# Patient Record
Sex: Female | Born: 1960 | Race: White | Hispanic: No | Marital: Married | Smoking: Former smoker
Health system: Southern US, Community
[De-identification: ages and names within clinical notes are randomized; demographics above are authoritative.]

## PROBLEM LIST (undated history)

## (undated) DIAGNOSIS — E119 Type 2 diabetes mellitus without complications: Secondary | ICD-10-CM

## (undated) DIAGNOSIS — I739 Peripheral vascular disease, unspecified: Secondary | ICD-10-CM

## (undated) HISTORY — PX: KNEE CARTILAGE SURGERY: SHX688

## (undated) HISTORY — PX: HERNIA REPAIR: SHX51

## (undated) HISTORY — DX: Peripheral vascular disease, unspecified: I73.9

## (undated) HISTORY — DX: Type 2 diabetes mellitus without complications: E11.9

---

## 2000-07-19 ENCOUNTER — Encounter: Payer: Self-pay | Admitting: Family Medicine

## 2000-07-19 ENCOUNTER — Ambulatory Visit (HOSPITAL_COMMUNITY): Admission: RE | Admit: 2000-07-19 | Discharge: 2000-07-19 | Payer: Self-pay | Admitting: Family Medicine

## 2000-07-26 ENCOUNTER — Encounter: Payer: Self-pay | Admitting: General Surgery

## 2000-07-26 ENCOUNTER — Encounter: Admission: RE | Admit: 2000-07-26 | Discharge: 2000-07-26 | Payer: Self-pay | Admitting: General Surgery

## 2000-08-08 ENCOUNTER — Ambulatory Visit (HOSPITAL_BASED_OUTPATIENT_CLINIC_OR_DEPARTMENT_OTHER): Admission: RE | Admit: 2000-08-08 | Discharge: 2000-08-08 | Payer: Self-pay | Admitting: General Surgery

## 2006-01-31 ENCOUNTER — Ambulatory Visit: Payer: Self-pay | Admitting: Internal Medicine

## 2006-03-28 ENCOUNTER — Other Ambulatory Visit: Payer: Self-pay

## 2006-04-02 ENCOUNTER — Inpatient Hospital Stay: Payer: Self-pay | Admitting: Obstetrics and Gynecology

## 2006-05-01 HISTORY — PX: ABDOMINAL HYSTERECTOMY: SHX81

## 2011-07-17 ENCOUNTER — Ambulatory Visit (HOSPITAL_COMMUNITY)
Admission: EM | Admit: 2011-07-17 | Discharge: 2011-07-17 | Discharge status: Against Medical Advice | Payer: Self-pay | Source: Ambulatory Visit | Attending: Cardiovascular Disease | Admitting: Cardiovascular Disease

## 2011-07-17 ENCOUNTER — Encounter (HOSPITAL_COMMUNITY)
Admission: EM | Discharge status: Absent without leave | Payer: Self-pay | Source: Ambulatory Visit | Attending: Cardiovascular Disease

## 2011-07-17 DIAGNOSIS — Z538 Procedure and treatment not carried out for other reasons: Secondary | ICD-10-CM | POA: Insufficient documentation

## 2011-07-17 DIAGNOSIS — R079 Chest pain, unspecified: Secondary | ICD-10-CM | POA: Insufficient documentation

## 2011-07-17 SURGERY — LEFT HEART CATHETERIZATION WITH CORONARY ANGIOGRAM
Anesthesia: LOCAL

## 2011-07-17 MED ORDER — FENTANYL CITRATE 0.05 MG/ML IJ SOLN
INTRAMUSCULAR | Status: AC
  Filled 2011-07-17: qty 2

## 2011-07-17 MED ORDER — MIDAZOLAM HCL 2 MG/2ML IJ SOLN
INTRAMUSCULAR | Status: AC
  Filled 2011-07-17: qty 2

## 2011-07-17 MED ORDER — BIVALIRUDIN 250 MG IV SOLR
INTRAVENOUS | Status: AC
  Filled 2011-07-17: qty 250

## 2012-01-17 ENCOUNTER — Ambulatory Visit: Payer: Self-pay | Admitting: General Practice

## 2013-05-29 ENCOUNTER — Ambulatory Visit: Payer: Self-pay | Admitting: Internal Medicine

## 2013-09-04 DIAGNOSIS — K5900 Constipation, unspecified: Secondary | ICD-10-CM | POA: Insufficient documentation

## 2013-09-04 DIAGNOSIS — M8580 Other specified disorders of bone density and structure, unspecified site: Secondary | ICD-10-CM | POA: Insufficient documentation

## 2013-09-04 DIAGNOSIS — F419 Anxiety disorder, unspecified: Secondary | ICD-10-CM | POA: Insufficient documentation

## 2014-01-07 DIAGNOSIS — J309 Allergic rhinitis, unspecified: Secondary | ICD-10-CM | POA: Insufficient documentation

## 2014-05-28 ENCOUNTER — Ambulatory Visit: Payer: Self-pay | Admitting: Family Medicine

## 2015-03-30 DIAGNOSIS — R7303 Prediabetes: Secondary | ICD-10-CM | POA: Insufficient documentation

## 2015-03-31 ENCOUNTER — Other Ambulatory Visit: Payer: Self-pay | Admitting: Internal Medicine

## 2015-03-31 DIAGNOSIS — Z1239 Encounter for other screening for malignant neoplasm of breast: Secondary | ICD-10-CM

## 2015-08-30 ENCOUNTER — Encounter: Admission: RE | Payer: Self-pay | Source: Ambulatory Visit

## 2015-08-30 ENCOUNTER — Ambulatory Visit
Admission: RE | Admit: 2015-08-30 | Payer: PRIVATE HEALTH INSURANCE | Source: Ambulatory Visit | Admitting: Gastroenterology

## 2015-08-30 SURGERY — COLONOSCOPY WITH PROPOFOL
Anesthesia: General

## 2015-09-03 ENCOUNTER — Ambulatory Visit
Admission: RE | Admit: 2015-09-03 | Payer: PRIVATE HEALTH INSURANCE | Source: Ambulatory Visit | Admitting: Gastroenterology

## 2015-09-03 ENCOUNTER — Encounter: Admission: RE | Payer: Self-pay | Source: Ambulatory Visit

## 2015-09-03 SURGERY — COLONOSCOPY WITH PROPOFOL
Anesthesia: General

## 2016-04-01 DIAGNOSIS — M25572 Pain in left ankle and joints of left foot: Secondary | ICD-10-CM | POA: Insufficient documentation

## 2016-04-14 ENCOUNTER — Encounter (INDEPENDENT_AMBULATORY_CARE_PROVIDER_SITE_OTHER): Payer: Self-pay | Admitting: Vascular Surgery

## 2016-04-18 ENCOUNTER — Encounter (INDEPENDENT_AMBULATORY_CARE_PROVIDER_SITE_OTHER): Payer: Self-pay | Admitting: Vascular Surgery

## 2016-04-18 ENCOUNTER — Ambulatory Visit (INDEPENDENT_AMBULATORY_CARE_PROVIDER_SITE_OTHER): Payer: PRIVATE HEALTH INSURANCE | Admitting: Vascular Surgery

## 2016-04-18 VITALS — BP 116/78 | HR 73 | Resp 16 | Ht 67.0 in | Wt 190.0 lb

## 2016-04-18 DIAGNOSIS — M79605 Pain in left leg: Secondary | ICD-10-CM

## 2016-04-18 DIAGNOSIS — E119 Type 2 diabetes mellitus without complications: Secondary | ICD-10-CM | POA: Diagnosis not present

## 2016-04-18 DIAGNOSIS — M7989 Other specified soft tissue disorders: Secondary | ICD-10-CM | POA: Insufficient documentation

## 2016-04-18 DIAGNOSIS — M79609 Pain in unspecified limb: Secondary | ICD-10-CM | POA: Insufficient documentation

## 2016-04-18 DIAGNOSIS — M79604 Pain in right leg: Secondary | ICD-10-CM | POA: Diagnosis not present

## 2016-04-18 DIAGNOSIS — I83813 Varicose veins of bilateral lower extremities with pain: Secondary | ICD-10-CM | POA: Diagnosis not present

## 2016-04-18 NOTE — Assessment & Plan Note (Signed)
See below

## 2016-04-18 NOTE — Assessment & Plan Note (Signed)
Suspicious for venous disease.  Could have musculoskeletal pain as well.

## 2016-04-18 NOTE — Assessment & Plan Note (Signed)
Suspicious for venous disease.  See below

## 2016-04-18 NOTE — Progress Notes (Signed)
Patient ID: Makayla Villarreal, female   DOB: 12/11/1960, 55 y.o.   MRN: [id number]  Chief Complaint  Patient presents with  . New Evaluation    Varicose Veins    HPI Makayla Glennon HamiltonRenee Villarreal is a 55 y.o. female.  I am asked to see the patient by Dr. Thedore MinsSingh for evaluation of painful varicose veins.  The patient presents with complaints of symptomatic varicosities of the Lower extremities bilaterally. The patient reports a long standing history of varicosities and they have become painful over time. There was no clear inciting event or causative factor that started the symptoms.  The left leg is more severly affected. The patient elevates the legs for relief. The pain is described as aching and heaviness as well as tiredness in the evenings are. The symptoms are generally most severe in the evening, particularly when they have been on their feet for long periods of time. Elevation and anti-inflammatories has been used to try to improve the symptoms with limited success. The patient complains of mild bilateral lower extremity swelling as an associated symptom. The patient has no previous history of deep venous thrombosis or superficial thrombophlebitis to their knowledge.     Past Medical History:  Diagnosis Date  . Diabetes mellitus without complication (HCC)   . Peripheral arterial disease [facility](HCC)     Past Surgical History:  Procedure Laterality Date  . CESAREAN SECTION     twice  . HERNIA REPAIR    . KNEE CARTILAGE SURGERY      Family History  Problem Relation Age of Onset  . Diabetes Mother   . Hyperlipidemia Mother   . Varicose Veins Mother   . Varicose Veins Maternal Grandfather   no bleeding or clotting disorders  Social History Social History  Substance Use Topics  . Smoking status: Former Smoker    Quit date: 2005  . Smokeless tobacco: Never Used  . Alcohol use Yes  No IVDU  Allergies  Allergen Reactions  . Codeine Nausea Only    Other reaction(s): Hallucination  .  Etodolac     Other reaction(s): Other (See Comments) Swelling/stomach issues with burning    Current Outpatient Prescriptions  Medication Sig Dispense Refill  . Ascorbic Acid (VITAMIN C) 1000 MG tablet Take by mouth.    . Garlic 1000 MG CAPS Take by mouth.    Marland Kitchen. ibuprofen (ADVIL,MOTRIN) 200 MG tablet Take by mouth.    . Loratadine 10 MG CAPS Take by mouth.    . magnesium oxide (MAG-OX) 400 MG tablet Take by mouth.    . metFORMIN (GLUCOPHAGE) 500 MG tablet take 1 tablet by mouth twice a day with meals    . thyroid (ARMOUR THYROID) 120 MG tablet Take by mouth.    . vitamin E 400 UNIT capsule Take by mouth.     No current facility-administered medications for this visit.       REVIEW OF SYSTEMS (Negative unless checked)  Constitutional: [] Weight loss  [] Fever  [] Chills Cardiac: [] Chest pain   [] Chest pressure   [] Palpitations   [] Shortness of breath when laying flat   [] Shortness of breath at rest   [] Shortness of breath with exertion. Vascular:  [] Pain in legs with walking   [] Pain in legs at rest   [] Pain in legs when laying flat   [] Claudication   [x] Pain in feet when walking  [] Pain in feet at rest  [] Pain in feet when laying flat   [] History of DVT   [] Phlebitis   [  x]Swelling in legs   [x] Varicose veins   [] Non-healing ulcers Pulmonary:   [] Uses home oxygen   [] Productive cough   [] Hemoptysis   [] Wheeze  [] COPD   [] Asthma Neurologic:  [] Dizziness  [] Blackouts   [] Seizures   [] History of stroke   [] History of TIA  [] Aphasia   [] Temporary blindness   [] Dysphagia   [] Weakness or numbness in arms   [] Weakness or numbness in legs Musculoskeletal:  [x] Arthritis   [] Joint swelling   [x] Joint pain   [] Low back pain Hematologic:  [] Easy bruising  [] Easy bleeding   [] Hypercoagulable state   [] Anemic  [] Hepatitis Gastrointestinal:  [] Blood in stool   [] Vomiting blood  [] Gastroesophageal reflux/heartburn   [] Abdominal pain Genitourinary:  [] Chronic kidney disease   [] Difficult urination   [] Frequent urination  [] Burning with urination   [] Hematuria Skin:  [] Rashes   [] Ulcers   [] Wounds Psychological:  [] History of anxiety   []  History of major depression.    Physical Exam BP 116/78 (BP Location: Right Arm)   Pulse 73   Resp 16   Ht 5\' 7"  (1.702 m)   Wt 190 lb (86.2 kg)   BMI 29.76 kg/m  Gen:  WD/WN, NAD Head: /AT, No temporalis wasting.  Ear/Nose/Throat: Hearing grossly intact, dentition good Eyes: Sclera non-icteric. Conjunctiva clear Neck: Supple, no nuchal rigidity. Trachea midline Pulmonary:  Good air movement, no use of accessory muscles, respirations not labored.  Cardiac: RRR, No JVD Vascular: Varicosities diffuse and measuring up to 3 mm in the right lower extremity        Varicosities scattered and measuring up to 2 mm in the left lower extremity Vessel Right Left  Radial Palpable Palpable  Ulnar Palpable Palpable  Brachial Palpable Palpable  Carotid Palpable, without bruit Palpable, without bruit  Aorta Not palpable N/A  Femoral Palpable Palpable  Popliteal Palpable Palpable  PT Palpable Palpable  DP Palpable Palpable   Gastrointestinal: soft, non-tender/non-distended. No guarding/reflex. No masses, surgical incisions, or scars. Musculoskeletal: M/S 5/5 throughout.  Trace RLE edema. Trace LLE edema Neurologic: Sensation grossly intact in extremities.  Symmetrical.  Speech is fluent.  Psychiatric: Judgment intact, Mood & affect appropriate for pt's clinical situation. Dermatologic: No rashes or ulcers noted.  No cellulitis or open wounds. Lymph : No Cervical, Axillary, or Inguinal lymphadenopathy.   Radiology No results found.  Labs No results found for this or any previous visit (from the past 2160 hour(s)).  Assessment/Plan:  Diabetes (HCC) blood glucose control important in reducing the progression of atherosclerotic disease. Also, involved in wound healing. On appropriate medications.   Pain in limb Suspicious for venous disease.   Could have musculoskeletal pain as well.  Swelling of limb Suspicious for venous disease.  See below  Varicose veins of leg with pain, bilateral See below    The patient has symptoms consistent with chronic venous insufficiency. We discussed the natural history and treatment options for venous disease. I recommended the regular use of 20 - 30 mm Hg compression stockings, and prescribed these today. I recommended leg elevation and anti-inflammatories as needed for pain. I have also recommended a complete venous duplex to assess the venous system for reflux or thrombotic issues. This can be done at the patient's convenience. I will see the patient back in 3 months to assess the response to conservative management, and determine further treatment options.     Festus BarrenJason Nyleah Mcginnis 04/18/2016, 4:02 PM   This note was created with Dragon medical transcription system.  Any errors from dictation are  unintentional.

## 2016-04-18 NOTE — Patient Instructions (Signed)
Varicose Veins Varicose veins are veins that have become enlarged and twisted. They are usually seen in the legs but can occur in other parts of the body as well. CAUSES This condition is the result of valves in the veins not working properly. Valves in the veins help to return blood from the leg to the heart. If these valves are damaged, blood flows backward and backs up into the veins in the leg near the skin. This causes the veins to become larger. RISK FACTORS People who are on their feet a lot, who are pregnant, or who are overweight are more likely to develop varicose veins. SIGNS AND SYMPTOMS  Bulging, twisted-appearing, bluish veins, most commonly found on the legs.  Leg pain or a feeling of heaviness. These symptoms may be worse at the end of the day.  Leg swelling.  Changes in skin color. DIAGNOSIS A health care provider can usually diagnose varicose veins by examining your legs. Your health care provider may also recommend an ultrasound of your leg veins. TREATMENT Most varicose veins can be treated at home.However, other treatments are available for people who have persistent symptoms or want to improve the cosmetic appearance of the varicose veins. These treatment options include:  Sclerotherapy. A solution is injected into the vein to close it off.  Laser treatment. A laser is used to heat the vein to close it off.  Radiofrequency vein ablation. An electrical current produced by radio waves is used to close off the vein.  Phlebectomy. The vein is surgically removed through small incisions made over the varicose vein.  Vein ligation and stripping. The vein is surgically removed through incisions made over the varicose vein after the vein has been tied (ligated). HOME CARE INSTRUCTIONS   Do not stand or sit in one position for long periods of time. Do not sit with your legs crossed. Rest with your legs raised during the day.  Wear compression stockings as directed by  your health care provider. These stockings help to prevent blood clots and reduce swelling in your legs.  Do not wear other tight, encircling garments around your legs, pelvis, or waist.  Walk as much as possible to increase blood flow.  Raise the foot of your bed at night with 2-inch blocks.  If you get a cut in the skin over the vein and the vein bleeds, lie down with your leg raised and press on it with a clean cloth until the bleeding stops. Then place a bandage (dressing) on the cut. See your health care provider if it continues to bleed. SEEK MEDICAL CARE IF:  The skin around your ankle starts to break down.  You have pain, redness, tenderness, or hard swelling in your leg over a vein.  You are uncomfortable because of leg pain. This information is not intended to replace advice given to you by your health care provider. Make sure you discuss any questions you have with your health care provider. Document Released: 01/25/2005 Document Revised: 08/09/2015 Document Reviewed: 09/02/2013 Elsevier Interactive Patient Education  2017 [employer]  

## 2016-04-18 NOTE — Assessment & Plan Note (Signed)
blood glucose control important in reducing the progression of atherosclerotic disease. Also, involved in wound healing. On appropriate medications.  

## 2016-07-10 ENCOUNTER — Other Ambulatory Visit: Payer: Self-pay | Admitting: Internal Medicine

## 2016-07-10 DIAGNOSIS — M5416 Radiculopathy, lumbar region: Secondary | ICD-10-CM

## 2016-07-11 DIAGNOSIS — G8929 Other chronic pain: Secondary | ICD-10-CM | POA: Insufficient documentation

## 2016-07-11 DIAGNOSIS — M5416 Radiculopathy, lumbar region: Secondary | ICD-10-CM | POA: Insufficient documentation

## 2016-07-18 ENCOUNTER — Ambulatory Visit (INDEPENDENT_AMBULATORY_CARE_PROVIDER_SITE_OTHER): Payer: PRIVATE HEALTH INSURANCE | Admitting: Vascular Surgery

## 2016-07-18 ENCOUNTER — Encounter (INDEPENDENT_AMBULATORY_CARE_PROVIDER_SITE_OTHER): Payer: Self-pay

## 2016-07-18 ENCOUNTER — Ambulatory Visit (INDEPENDENT_AMBULATORY_CARE_PROVIDER_SITE_OTHER): Payer: PRIVATE HEALTH INSURANCE

## 2016-07-18 ENCOUNTER — Encounter (INDEPENDENT_AMBULATORY_CARE_PROVIDER_SITE_OTHER): Payer: Self-pay | Admitting: Vascular Surgery

## 2016-07-18 VITALS — BP 130/73 | HR 58 | Resp 16 | Ht 67.5 in | Wt 189.0 lb

## 2016-07-18 DIAGNOSIS — I872 Venous insufficiency (chronic) (peripheral): Secondary | ICD-10-CM | POA: Diagnosis not present

## 2016-07-18 DIAGNOSIS — I83813 Varicose veins of bilateral lower extremities with pain: Secondary | ICD-10-CM | POA: Diagnosis not present

## 2016-07-18 DIAGNOSIS — E119 Type 2 diabetes mellitus without complications: Secondary | ICD-10-CM

## 2016-07-21 DIAGNOSIS — I872 Venous insufficiency (chronic) (peripheral): Secondary | ICD-10-CM | POA: Insufficient documentation

## 2016-07-21 NOTE — Progress Notes (Signed)
Subjective:    Patient ID: Makayla Villarreal, female    DOB: 11-20-1960, 56 y.o.   MRN: [id number] Chief Complaint  Patient presents with  . Re-evaluation    Ultrasound follow up   Patient presents to review vascular studies. Patient last seen on 04/18/16 for evaluation of painful varicose veins.  The patient presents with complaints of symptomatic varicosities of the Lower extremities bilaterally. The patient reports a long standing history of varicosities and they have become painful over time. There was no clear inciting event or causative factor that started the symptoms.  The left leg is more severly affected. The patient elevates the legs for relief. The pain is described as aching and heaviness as well as tiredness in the evenings are. The symptoms are generally most severe in the evening, particularly when they have been on their feet for long periods of time. The patient complains of mild bilateral lower extremity swelling as an associated symptom. The patient has no previous history of deep venous thrombosis or superficial thrombophlebitis to their knowledge. Over the last three months, the patient has worn medical grade one compression stockings, engaged in elevation of her lower extremities and has remained active with minimal relief requiring the use of OTC analgesics. Her discomfort persists and has started to interfere with her ability to function on a daily basis.    Review of Systems  Constitutional: Negative.   HENT: Negative.   Eyes: Negative.   Respiratory: Negative.   Cardiovascular: Positive for leg swelling.       Lower Extremity Pain  Gastrointestinal: Negative.   Endocrine: Negative.   Genitourinary: Negative.   Musculoskeletal: Negative.   Skin: Negative.   Allergic/Immunologic: Negative.   Neurological: Negative.   Hematological: Negative.   Psychiatric/Behavioral: Negative.        Objective:   Physical Exam  Constitutional: She is oriented to person, place,  and time. She appears well-developed and well-nourished.  HENT:  Head: Normocephalic and atraumatic.  Right Ear: External ear normal.  Left Ear: External ear normal.  Eyes: Conjunctivae and EOM are normal. Pupils are equal, round, and reactive to light.  Neck: Normal range of motion.  Cardiovascular: Normal rate, regular rhythm, normal heart sounds and intact distal pulses.   Pulses:      Radial pulses are 2+ on the right side, and 2+ on the left side.       Dorsalis pedis pulses are 2+ on the right side, and 2+ on the left side.       Posterior tibial pulses are 2+ on the right side, and 2+ on the left side.  Pulmonary/Chest: Effort normal.  Musculoskeletal: Normal range of motion. She exhibits edema (Bilateral Lower Extremity Moderate Edema. >1cm varicose veins noted bilatearally. ).  Neurological: She is alert and oriented to person, place, and time.  Skin: Skin is warm and dry.  Psychiatric: She has a normal mood and affect. Her behavior is normal. Judgment and thought content normal.   BP 130/73 (BP Location: Left Arm)   Pulse (!) 58   Resp 16   Ht 5' 7.5" (1.715 m)   Wt 189 lb (85.7 kg)   BMI 29.16 kg/m   Past Medical History:  Diagnosis Date  . Diabetes mellitus without complication (HCC)   . Peripheral arterial disease (HCC)    Social History   Social History  . Marital status: Married    Spouse name: N/A  . Number of children: N/A  . Years of education: N/A  Occupational History  . Not on file.   Social History Main Topics  . Smoking status: Former Smoker    Quit date: 2005  . Smokeless tobacco: Never Used  . Alcohol use Yes  . Drug use: No  . Sexual activity: Not on file   Other Topics Concern  . Not on file   Social History Narrative  . No narrative on file   Past Surgical History:  Procedure Laterality Date  . CESAREAN SECTION     twice  . HERNIA REPAIR    . KNEE CARTILAGE SURGERY     Family History  Problem Relation Age of Onset  .  Diabetes Mother   . Hyperlipidemia Mother   . Varicose Veins Mother   . Varicose Veins Maternal Grandfather    Allergies  Allergen Reactions  . Codeine Nausea Only    Other reaction(s): Hallucination  . Etodolac     Other reaction(s): Other (See Comments) Swelling/stomach issues with burning      Assessment & Plan:  Patient presents to review vascular studies. Patient last seen on 04/18/16 for evaluation of painful varicose veins.  The patient presents with complaints of symptomatic varicosities of the Lower extremities bilaterally. The patient reports a long standing history of varicosities and they have become painful over time. There was no clear inciting event or causative factor that started the symptoms.  The left leg is more severly affected. The patient elevates the legs for relief. The pain is described as aching and heaviness as well as tiredness in the evenings are. The symptoms are generally most severe in the evening, particularly when they have been on their feet for long periods of time. The patient complains of mild bilateral lower extremity swelling as an associated symptom. The patient has no previous history of deep venous thrombosis or superficial thrombophlebitis to their knowledge. Over the last three months, the patient has worn medical grade one compression stockings, engaged in elevation of her lower extremities and has remained active with minimal relief requiring the use of OTC analgesics. Her discomfort persists and has started to interfere with her ability to function on a daily basis.   1. Venous insufficiency of both lower extremities - New Patient with reflux on duplex. Minimal improvement in symptoms and physical exam. Patient has tried conservative therapy without improvement in symptoms. The patient is likely to benefit from endovenous laser ablation. I have discussed the risks and benefits of the procedure. The risks primarily include DVT, recanalization,  bleeding, infection, and inability to gain access. Will call patient with insurance approval.  2. Varicose veins of leg with pain, bilateral - Worsening Patient with reflux on duplex. Minimal improvement in symptoms and physical exam. Patient has tried conservative therapy without improvement in symptoms. The patient is likely to benefit from endovenous laser ablation. I have discussed the risks and benefits of the procedure. The risks primarily include DVT, recanalization, bleeding, infection, and inability to gain access. Will call patient with insurance approval.  3. Type 2 diabetes mellitus without complication, without long-term current use of insulin (HCC) - Stable Encouraged good control as its slows the progression of atherosclerotic disease  Current Outpatient Prescriptions on File Prior to Visit  Medication Sig Dispense Refill  . Ascorbic Acid (VITAMIN C) 1000 MG tablet Take by mouth.    . Garlic 1000 MG CAPS Take by mouth.    Marland Kitchen. ibuprofen (ADVIL,MOTRIN) 200 MG tablet Take by mouth.    . Loratadine 10 MG CAPS Take by mouth.    .Marland Kitchen  magnesium oxide (MAG-OX) 400 MG tablet Take by mouth.    . metFORMIN (GLUCOPHAGE) 500 MG tablet take 1 tablet by mouth twice a day with meals    . thyroid (ARMOUR THYROID) 120 MG tablet Take by mouth.    . vitamin E 400 UNIT capsule Take by mouth.     No current facility-administered medications on file prior to visit.    There are no Patient Instructions on file for this visit. No Follow-up on file.  Lanecia Sliva A Khristina Janota, PA-C

## 2016-07-24 ENCOUNTER — Ambulatory Visit
Admission: RE | Admit: 2016-07-24 | Discharge: 2016-07-24 | Disposition: A | Discharge status: Home / Self Care | Payer: PRIVATE HEALTH INSURANCE | Source: Ambulatory Visit | Attending: Internal Medicine | Admitting: Internal Medicine

## 2016-07-24 DIAGNOSIS — M5126 Other intervertebral disc displacement, lumbar region: Secondary | ICD-10-CM | POA: Insufficient documentation

## 2016-07-24 DIAGNOSIS — M5416 Radiculopathy, lumbar region: Secondary | ICD-10-CM | POA: Diagnosis present

## 2016-07-24 DIAGNOSIS — M48061 Spinal stenosis, lumbar region without neurogenic claudication: Secondary | ICD-10-CM | POA: Insufficient documentation

## 2016-07-24 DIAGNOSIS — M4696 Unspecified inflammatory spondylopathy, lumbar region: Secondary | ICD-10-CM | POA: Diagnosis not present

## 2016-07-24 LAB — POCT I-STAT CREATININE: Creatinine, Ser: 1.2 mg/dL — ABNORMAL HIGH (ref 0.44–1.00)

## 2016-07-24 MED ORDER — GADOBENATE DIMEGLUMINE 529 MG/ML IV SOLN
20.0000 mL | Freq: Once | INTRAVENOUS | Status: AC | PRN
  Administered 2016-07-24: 18 mL via INTRAVENOUS

## 2016-10-27 ENCOUNTER — Encounter (INDEPENDENT_AMBULATORY_CARE_PROVIDER_SITE_OTHER): Payer: Self-pay | Admitting: Vascular Surgery

## 2016-10-27 ENCOUNTER — Ambulatory Visit (INDEPENDENT_AMBULATORY_CARE_PROVIDER_SITE_OTHER): Payer: PRIVATE HEALTH INSURANCE | Admitting: Vascular Surgery

## 2016-10-27 VITALS — BP 110/60 | HR 72 | Resp 16 | Wt 188.0 lb

## 2016-10-27 DIAGNOSIS — I83813 Varicose veins of bilateral lower extremities with pain: Secondary | ICD-10-CM | POA: Diagnosis not present

## 2016-10-27 NOTE — Progress Notes (Signed)
No problem-specific Assessment & Plan notes found for this encounter.   The patient's right lower extremity was sterilely prepped and draped. The ultrasound machine was used to visualize the saphenous vein throughout its course. A segment in the lower thigh was selected for access. The saphenous vein was accessed with a mild amount of difficulty using ultrasound guidance with a micro puncture needle. A micro puncture wire and sheath were then placed. A 0.018 wire was placed beyond the saphenofemoral junction through the sheath and the micro puncture sheath was removed. The 65 cm sheath was then placed over the wire and the wire and dilator were removed. The laser fiber was placed through the sheath and its tip was placed approximately 3-4 cm below the saphenofemoral junction. Tumescent anesthesia was then created with a dilute lidocaine solution. Laser energy was then delivered with constant withdrawal of the sheath and laser fiber. Approximately 803 Joules of energy were delivered over a length of 19 cm using the 1470 Hz VenaCure machine at [employer]. Sterile dressings were placed. The patient tolerated the procedure well without complications.

## 2016-10-27 NOTE — Assessment & Plan Note (Signed)
See laser note 

## 2016-10-31 ENCOUNTER — Ambulatory Visit (INDEPENDENT_AMBULATORY_CARE_PROVIDER_SITE_OTHER): Payer: PRIVATE HEALTH INSURANCE

## 2016-10-31 DIAGNOSIS — I83813 Varicose veins of bilateral lower extremities with pain: Secondary | ICD-10-CM | POA: Diagnosis not present

## 2016-11-03 ENCOUNTER — Ambulatory Visit (INDEPENDENT_AMBULATORY_CARE_PROVIDER_SITE_OTHER): Payer: PRIVATE HEALTH INSURANCE | Admitting: Vascular Surgery

## 2016-11-03 ENCOUNTER — Encounter (INDEPENDENT_AMBULATORY_CARE_PROVIDER_SITE_OTHER): Payer: Self-pay | Admitting: Vascular Surgery

## 2016-11-03 VITALS — BP 108/70 | HR 66 | Resp 14 | Ht 67.0 in | Wt 190.0 lb

## 2016-11-03 DIAGNOSIS — I83813 Varicose veins of bilateral lower extremities with pain: Secondary | ICD-10-CM

## 2016-11-03 NOTE — Progress Notes (Signed)
Varicose veins of leg with pain, bilateral See laser note   The patient's left lower extremity was sterilely prepped and draped. The ultrasound machine was used to visualize the saphenous vein throughout its course. A segment just below the knee was selected for access. The saphenous vein was accessed with minimal difficulty using ultrasound guidance with a micro puncture needle. A micro puncture wire and sheath were then placed. A 0.018 wire was placed beyond the saphenofemoral junction through the sheath and the micro puncture sheath was removed. The 65 cm sheath was then placed over the wire and the wire and dilator were removed. The laser fiber was placed through the sheath and its tip was placed approximately 4 cm below the saphenofemoral junction. Tumescent anesthesia was then created with a dilute lidocaine solution. Laser energy was then delivered with constant withdrawal of the sheath and laser fiber. Approximately 1230 Joules of energy were delivered over a length of 33 cm using the 1470 Hz VenaCure machine at [employer]. Sterile dressings were placed. The patient tolerated the procedure well without complications.

## 2016-11-03 NOTE — Assessment & Plan Note (Signed)
See laser note 

## 2016-11-06 ENCOUNTER — Other Ambulatory Visit (INDEPENDENT_AMBULATORY_CARE_PROVIDER_SITE_OTHER): Payer: PRIVATE HEALTH INSURANCE

## 2016-11-06 ENCOUNTER — Other Ambulatory Visit (INDEPENDENT_AMBULATORY_CARE_PROVIDER_SITE_OTHER): Payer: Self-pay | Admitting: Vascular Surgery

## 2016-11-06 DIAGNOSIS — I8392 Asymptomatic varicose veins of left lower extremity: Secondary | ICD-10-CM

## 2016-11-28 ENCOUNTER — Ambulatory Visit (INDEPENDENT_AMBULATORY_CARE_PROVIDER_SITE_OTHER): Payer: PRIVATE HEALTH INSURANCE | Admitting: Vascular Surgery

## 2016-11-28 ENCOUNTER — Encounter (INDEPENDENT_AMBULATORY_CARE_PROVIDER_SITE_OTHER): Payer: Self-pay | Admitting: Vascular Surgery

## 2016-11-28 VITALS — BP 120/69 | HR 63 | Resp 15 | Ht 67.0 in | Wt 191.0 lb

## 2016-11-28 DIAGNOSIS — E119 Type 2 diabetes mellitus without complications: Secondary | ICD-10-CM

## 2016-11-28 DIAGNOSIS — I83813 Varicose veins of bilateral lower extremities with pain: Secondary | ICD-10-CM | POA: Diagnosis not present

## 2016-11-28 DIAGNOSIS — M79604 Pain in right leg: Secondary | ICD-10-CM

## 2016-11-28 DIAGNOSIS — M79605 Pain in left leg: Secondary | ICD-10-CM | POA: Diagnosis not present

## 2016-11-28 NOTE — Assessment & Plan Note (Signed)
Marked improvement after laser ablation.  Residual varicosities are reasonably mild.  No sclerotherapy currently planned.  See in office in 6 months.

## 2016-11-28 NOTE — Assessment & Plan Note (Signed)
Improved after laser ablation

## 2016-11-28 NOTE — Progress Notes (Signed)
MRN : [id number]  Makayla Villarreal is a 56 y.o. (05/25/1960) female who presents with chief complaint of  Chief Complaint  Patient presents with  . Re-evaluation    3-4 week post laser, left leg  .  History of Present Illness: Patient returns today in follow up of venous insufficiency.  She has undergone BLE GSV EVLT without complications.  She notices significant improvement in the heaviness and tiredness in both legs.  She does not really have much residual painful varicosities in either leg.  She reports some soreness and bruising which have largely resolved.    Current Outpatient Prescriptions  Medication Sig Dispense Refill  . ALPRAZolam (XANAX) 0.5 MG tablet take 1 tablet by mouth ONE HOUR PRIOR TO PROCEDURE AND 1 TABLET WHEN YOU ARRIVE AT THE OFFICE  0  . Ascorbic Acid (VITAMIN C) 1000 MG tablet Take by mouth.    . diazepam (VALIUM) 2 MG tablet take 1 tablet by mouth at bedtime if needed for anxiety  0  . Garlic 1000 MG CAPS Take by mouth.    Marland Kitchen. ibuprofen (ADVIL,MOTRIN) 200 MG tablet Take by mouth.    . Loratadine 10 MG CAPS Take by mouth.    . magnesium oxide (MAG-OX) 400 MG tablet Take by mouth.    . metFORMIN (GLUCOPHAGE) 500 MG tablet take 1 tablet by mouth twice a day with meals    . thyroid (ARMOUR THYROID) 120 MG tablet Take by mouth.    . vitamin E 400 UNIT capsule Take by mouth.     No current facility-administered medications for this visit.     Past Medical History:  Diagnosis Date  . Diabetes mellitus without complication (HCC)   . Peripheral arterial disease [facility](HCC)     Past Surgical History:  Procedure Laterality Date  . CESAREAN SECTION     twice  . HERNIA REPAIR    . KNEE CARTILAGE SURGERY      Family History  Problem Relation Age of Onset  . Diabetes Mother   . Hyperlipidemia Mother   . Varicose Veins Mother   . Varicose Veins Maternal Grandfather   no bleeding or clotting disorders  Social History       Social History  Substance Use  Topics  . Smoking status: Former Smoker    Quit date: 2005  . Smokeless tobacco: Never Used  . Alcohol use Yes   No IVDU       Allergies  Allergen Reactions  . Codeine Nausea Only    Other reaction(s): Hallucination  . Etodolac     Other reaction(s): Other (See Comments) Swelling/stomach issues with burning     No current facility-administered medications for this visit.       REVIEW OF SYSTEMS (Negative unless checked)  Constitutional: [] Weight loss  [] Fever  [] Chills Cardiac: [] Chest pain   [] Chest pressure   [] Palpitations   [] Shortness of breath when laying flat   [] Shortness of breath at rest   [] Shortness of breath with exertion. Vascular:  [] Pain in legs with walking   [] Pain in legs at rest   [] Pain in legs when laying flat   [] Claudication   [x] Pain in feet when walking  [] Pain in feet at rest  [] Pain in feet when laying flat   [] History of DVT   [] Phlebitis   [x] Swelling in legs   [x] Varicose veins   [] Non-healing ulcers Pulmonary:   [] Uses home oxygen   [] Productive cough   [] Hemoptysis   [] Wheeze  [] COPD   []   Asthma Neurologic:  [] Dizziness  [] Blackouts   [] Seizures   [] History of stroke   [] History of TIA  [] Aphasia   [] Temporary blindness   [] Dysphagia   [] Weakness or numbness in arms   [] Weakness or numbness in legs Musculoskeletal:  [x] Arthritis   [] Joint swelling   [x] Joint pain   [] Low back pain Hematologic:  [] Easy bruising  [] Easy bleeding   [] Hypercoagulable state   [] Anemic  [] Hepatitis Gastrointestinal:  [] Blood in stool   [] Vomiting blood  [] Gastroesophageal reflux/heartburn   [] Abdominal pain Genitourinary:  [] Chronic kidney disease   [] Difficult urination  [] Frequent urination  [] Burning with urination   [] Hematuria Skin:  [] Rashes   [] Ulcers   [] Wounds Psychological:  [] History of anxiety   []  History of major depression.    Physical Examination  BP 120/69 (BP Location: Right Arm)   Pulse 63   Resp 15   Ht 5\' 7"  (1.702 m)   Wt 191  lb (86.6 kg)   BMI 29.91 kg/m  Gen:  WD/WN, NAD Head: [place]/AT, No temporalis wasting. Ear/Nose/Throat: Hearing grossly intact, nares w/o erythema or drainage, trachea midline Eyes: Conjunctiva clear. Sclera non-icteric Neck: Supple.  No JVD.  Pulmonary:  Good air movement, no use of accessory muscles.  Cardiac: RRR, normal S1, S2 Vascular:  Vessel Right Left  Radial Palpable Palpable                          PT Palpable Palpable  DP Palpable Palpable    Musculoskeletal: M/S 5/5 throughout.  No deformity or atrophy. Minimal LE edema. Scattered varicosities bilaterally, a little more on the right than the left Neurologic: Sensation grossly intact in extremities.  Symmetrical.  Speech is fluent.  Psychiatric: Judgment intact, Mood & affect appropriate for pt's clinical situation. Dermatologic: No rashes or ulcers noted.  No cellulitis or open wounds.       Labs No results found for this or any previous visit (from the past 2160 hour(s)).  Radiology No results found.    Assessment/Plan Diabetes (HCC) blood glucose control important in reducing the progression of atherosclerotic disease. Also, involved in wound healing. On appropriate medications.  Pain in limb Improved after laser ablation  Varicose veins of leg with pain, bilateral Marked improvement after laser ablation.  Residual varicosities are reasonably mild.  No sclerotherapy currently planned.  See in office in 6 months.    Festus BarrenJason Swetha Rayle, MD  11/28/2016 8:54 PM    This note was created with Dragon medical transcription system.  Any errors from dictation are purely unintentional

## 2017-06-01 ENCOUNTER — Ambulatory Visit (INDEPENDENT_AMBULATORY_CARE_PROVIDER_SITE_OTHER): Payer: PRIVATE HEALTH INSURANCE | Admitting: Vascular Surgery

## 2017-06-15 ENCOUNTER — Ambulatory Visit (INDEPENDENT_AMBULATORY_CARE_PROVIDER_SITE_OTHER): Payer: PRIVATE HEALTH INSURANCE | Admitting: Vascular Surgery

## 2017-06-26 ENCOUNTER — Ambulatory Visit (INDEPENDENT_AMBULATORY_CARE_PROVIDER_SITE_OTHER): Payer: PRIVATE HEALTH INSURANCE | Admitting: Vascular Surgery

## 2017-06-26 ENCOUNTER — Encounter (INDEPENDENT_AMBULATORY_CARE_PROVIDER_SITE_OTHER): Payer: Self-pay | Admitting: Vascular Surgery

## 2017-06-26 VITALS — BP 124/81 | HR 60 | Resp 17 | Ht 67.0 in | Wt 197.0 lb

## 2017-06-26 DIAGNOSIS — E119 Type 2 diabetes mellitus without complications: Secondary | ICD-10-CM | POA: Diagnosis not present

## 2017-06-26 DIAGNOSIS — M7989 Other specified soft tissue disorders: Secondary | ICD-10-CM | POA: Diagnosis not present

## 2017-06-26 DIAGNOSIS — I83813 Varicose veins of bilateral lower extremities with pain: Secondary | ICD-10-CM

## 2017-06-26 NOTE — Progress Notes (Signed)
MRN : [id number]  Makayla Glennon HamiltonRenee Villarreal is a 57 y.o. (06/08/1960) female who presents with chief complaint of  Chief Complaint  Patient presents with  . Follow-up    6 month no studies  .  History of Present Illness: Patient returns today in follow up of venous disease.  Overall, she is doing well.  She has noticed some mild pain and swelling in her right leg over the past several weeks as she has increased her activity at the gym.  This is not disabling and not as severe as it was no new ulceration or infection.  Wearing her compression stockings intermittently.         Current Outpatient Prescriptions  Medication Sig Dispense Refill  . ALPRAZolam (XANAX) 0.5 MG tablet take 1 tablet by mouth ONE HOUR PRIOR TO PROCEDURE AND 1 TABLET WHEN YOU ARRIVE AT THE OFFICE  0  . Ascorbic Acid (VITAMIN C) 1000 MG tablet Take by mouth.    . diazepam (VALIUM) 2 MG tablet take 1 tablet by mouth at bedtime if needed for anxiety  0  . Garlic 1000 MG CAPS Take by mouth.    Marland Kitchen. ibuprofen (ADVIL,MOTRIN) 200 MG tablet Take by mouth.    . Loratadine 10 MG CAPS Take by mouth.    . magnesium oxide (MAG-OX) 400 MG tablet Take by mouth.    . metFORMIN (GLUCOPHAGE) 500 MG tablet take 1 tablet by mouth twice a day with meals    . thyroid (ARMOUR THYROID) 120 MG tablet Take by mouth.    . vitamin E 400 UNIT capsule Take by mouth.     No current facility-administered medications for this visit.         Past Medical History:  Diagnosis Date  . Diabetes mellitus without complication (HCC)   . Peripheral arterial disease [facility](HCC)          Past Surgical History:  Procedure Laterality Date  . CESAREAN SECTION     twice  . HERNIA REPAIR    . KNEE CARTILAGE SURGERY           Family History  Problem Relation Age of Onset  . Diabetes Mother   . Hyperlipidemia Mother   . Varicose Veins Mother   . Varicose Veins Maternal Grandfather   no bleeding or clotting  disorders  Social History       Social History  Substance Use Topics  . Smoking status: Former Smoker    Quit date: 2005  . Smokeless tobacco: Never Used  . Alcohol use Yes   No IVDU       Allergies  Allergen Reactions  . Codeine Nausea Only    Other reaction(s): Hallucination  . Etodolac     Other reaction(s): Other (See Comments) Swelling/stomach issues with burning     No current facility-administered medications for this visit.       REVIEW OF SYSTEMS(Negative unless checked)  Constitutional: [] Weight loss[] Fever[] Chills Cardiac:[] Chest pain[] Chest pressure[] Palpitations [] Shortness of breath when laying flat [] Shortness of breath at rest [] Shortness of breath with exertion. Vascular: [] Pain in legs with walking[] Pain in legsat rest[] Pain in legs when laying flat [] Claudication [x] Pain in feet when walking [] Pain in feet at rest [] Pain in feet when laying flat [] History of DVT [] Phlebitis [x] Swelling in legs [x] Varicose veins [] Non-healing ulcers Pulmonary: [] Uses home oxygen [] Productive cough[] Hemoptysis [] Wheeze [] COPD [] Asthma Neurologic: [] Dizziness [] Blackouts [] Seizures [] History of stroke [] History of TIA[] Aphasia [] Temporary blindness[] Dysphagia [] Weaknessor numbness in arms [] Weakness or numbnessin legs Musculoskeletal: [x] Arthritis [] Joint swelling [x] Joint  pain [] Low back pain Hematologic:[] Easy bruising[] Easy bleeding [] Hypercoagulable state [] Anemic [] Hepatitis Gastrointestinal:[] Blood in stool[] Vomiting blood[] Gastroesophageal reflux/heartburn[] Abdominal pain Genitourinary: [] Chronic kidney disease [] Difficulturination [] Frequenturination [] Burning with urination[] Hematuria Skin: [] Rashes [] Ulcers [] Wounds Psychological: [] History of anxiety[] History of major depression.      Physical Examination  BP  124/81 (BP Location: Left Arm, Patient Position: Sitting)   Pulse 60   Resp 17   Ht 5\' 7"  (1.702 m)   Wt 89.4 kg (197 lb)   BMI 30.85 kg/m  Gen:  WD/WN, NAD Head: [place]/AT, No temporalis wasting. Ear/Nose/Throat: Hearing grossly intact, nares w/o erythema or drainage, trachea midline Eyes: Conjunctiva clear. Sclera non-icteric Neck: Supple.  No JVD.  Pulmonary:  Good air movement, no use of accessory muscles.  Cardiac: RRR Vascular:  Vessel Right Left  Radial Palpable Palpable                          PT Palpable Palpable  DP Palpable Palpable   Gastrointestinal: soft, non-tender/non-distended. No guarding/reflex.  Musculoskeletal: M/S 5/5 throughout.  No deformity or atrophy. No significant edema. Neurologic: Sensation grossly intact in extremities.  Symmetrical.  Speech is fluent.  Psychiatric: Judgment intact, Mood & affect appropriate for pt's clinical situation. Dermatologic: No rashes or ulcers noted.  No cellulitis or open wounds.       Labs No results found for this or any previous visit (from the past 2160 hour(s)).  Radiology No results found.   Assessment/Plan Diabetes (HCC) blood glucose control important in reducing the progression of atherosclerotic disease. Also, involved in wound healing. On appropriate medications.  Pain in limb Improved after laser ablation   Varicose veins of leg with pain, bilateral Overall doing fairly well after laser ablation of both great saphenous veins last year.  Wear compression stockings and elevate her legs as needed.  Agree with increasing activity.  Return to clinic as needed.    Makayla Barren, MD  06/26/2017 4:09 PM    This note was created with Dragon medical transcription system.  Any errors from dictation are purely unintentional

## 2017-06-26 NOTE — Assessment & Plan Note (Signed)
Overall doing fairly well after laser ablation of both great saphenous veins last year.  Wear compression stockings and elevate her legs as needed.  Agree with increasing activity.  Return to clinic as needed.

## 2017-11-20 ENCOUNTER — Other Ambulatory Visit: Payer: Self-pay | Admitting: Internal Medicine

## 2017-11-20 DIAGNOSIS — Z1231 Encounter for screening mammogram for malignant neoplasm of breast: Secondary | ICD-10-CM

## 2018-01-24 ENCOUNTER — Other Ambulatory Visit: Payer: Self-pay | Admitting: Internal Medicine

## 2018-01-24 DIAGNOSIS — R1011 Right upper quadrant pain: Secondary | ICD-10-CM

## 2018-01-29 ENCOUNTER — Ambulatory Visit
Admission: RE | Admit: 2018-01-29 | Discharge: 2018-01-29 | Disposition: A | Discharge status: Home / Self Care | Payer: PRIVATE HEALTH INSURANCE | Source: Ambulatory Visit | Attending: Internal Medicine | Admitting: Internal Medicine

## 2018-01-29 DIAGNOSIS — R1011 Right upper quadrant pain: Secondary | ICD-10-CM | POA: Diagnosis present

## 2018-02-08 ENCOUNTER — Other Ambulatory Visit: Payer: Self-pay | Admitting: Student

## 2018-02-08 DIAGNOSIS — R1011 Right upper quadrant pain: Secondary | ICD-10-CM

## 2018-03-04 ENCOUNTER — Ambulatory Visit
Admission: RE | Admit: 2018-03-04 | Discharge: 2018-03-04 | Disposition: A | Discharge status: Home / Self Care | Payer: PRIVATE HEALTH INSURANCE | Source: Ambulatory Visit | Attending: Student | Admitting: Student

## 2018-03-04 DIAGNOSIS — R1011 Right upper quadrant pain: Secondary | ICD-10-CM | POA: Insufficient documentation

## 2018-03-04 MED ORDER — TECHNETIUM TC 99M MEBROFENIN IV KIT
5.6270 | PACK | Freq: Once | INTRAVENOUS | Status: AC | PRN
  Administered 2018-03-04: 5.627 via INTRAVENOUS

## 2018-04-17 DIAGNOSIS — M159 Polyosteoarthritis, unspecified: Secondary | ICD-10-CM | POA: Insufficient documentation

## 2018-04-17 DIAGNOSIS — N951 Menopausal and female climacteric states: Secondary | ICD-10-CM | POA: Insufficient documentation

## 2018-04-23 IMAGING — MR MR LUMBAR SPINE WO/W CM
4 of 7 series · 19 of 48 positions shown · IV contrast (multihance)
Comparison: None.

CLINICAL DATA: Low back pain with bilateral leg pain

EXAM:
MRI LUMBAR SPINE WITHOUT AND WITH CONTRAST
TECHNIQUE: Multiplanar and multiecho pulse sequences of the lumbar spine were
obtained without and with intravenous contrast.
CONTRAST:  18mL MULTIHANCE GADOBENATE DIMEGLUMINE 529 MG/ML IV SOLN

[Series 2: T2 · sagittal · 4.0mm · 0.55mm/px · 5 of 17 slices shown (1 of 2)]
[im 1/17]
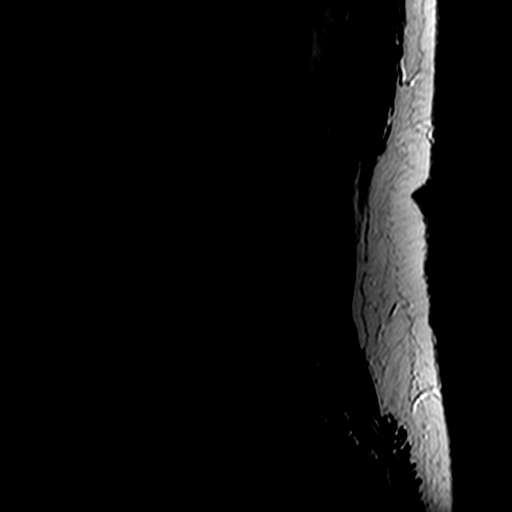
[im 5/17]
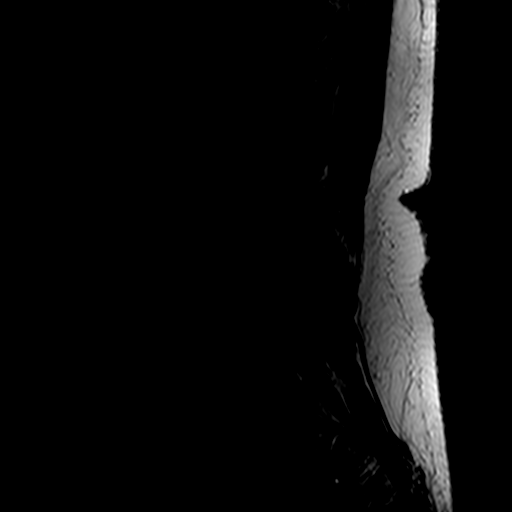
[im 9/17]
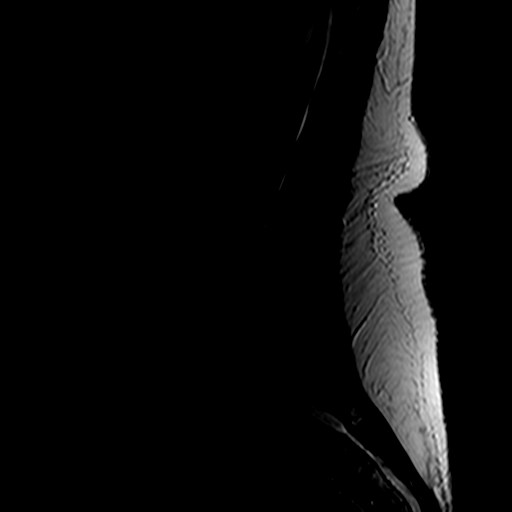
[im 13/17]
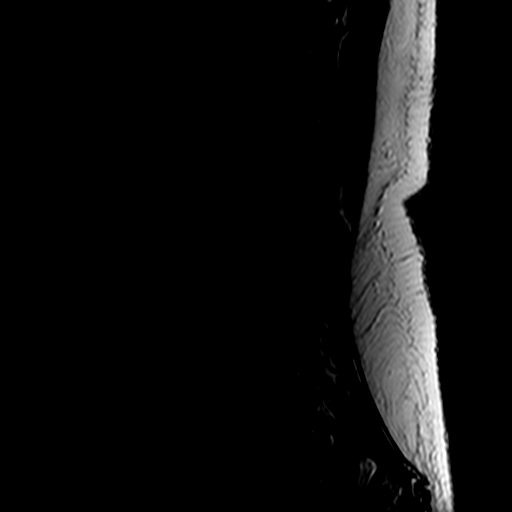
[im 17/17]
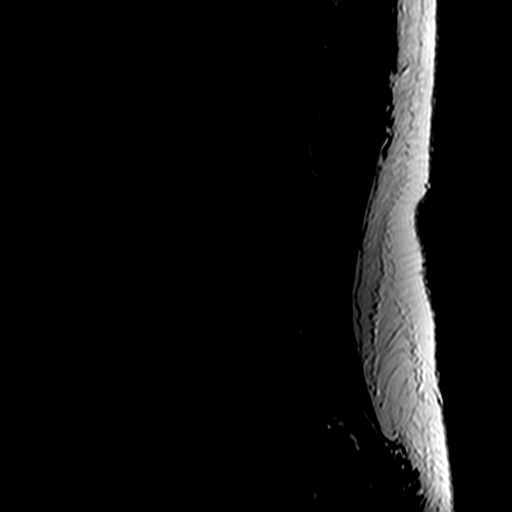

[Series 3: T1 · sagittal · 4.0mm · 0.55mm/px · 3 of 17 slices shown (1 of 2)]
[im 1/17]
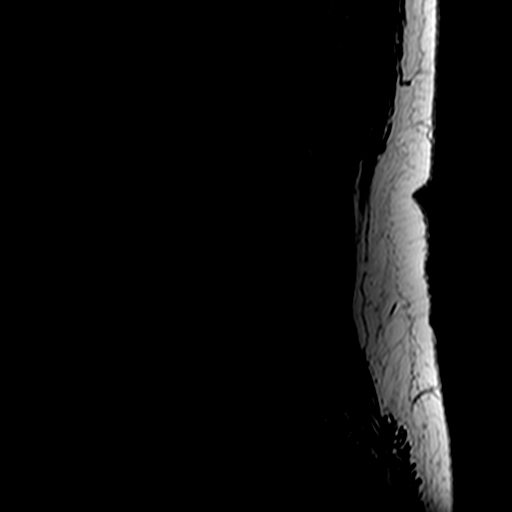
[im 9/17]
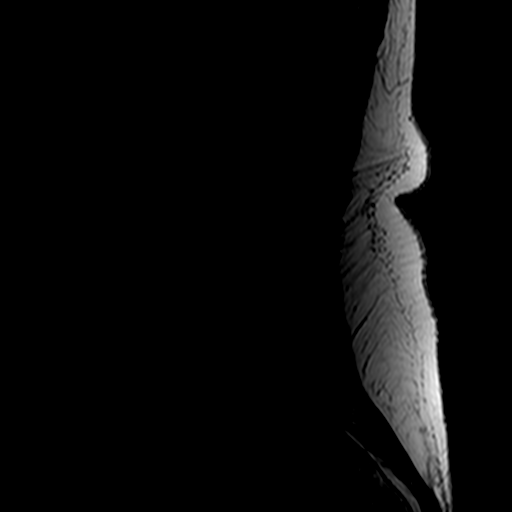
[im 17/17]
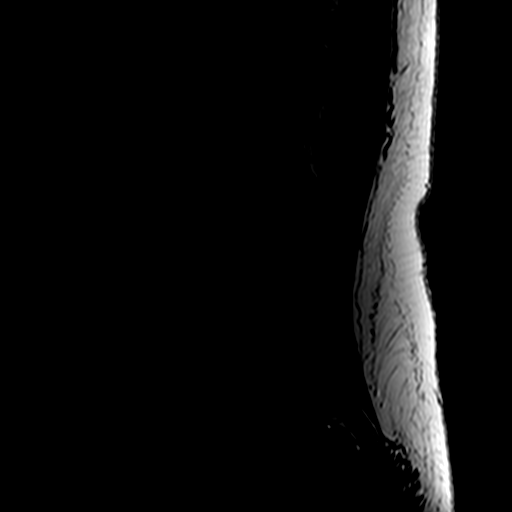

[Series 5: T2 · axial · 4.0mm · 0.39mm/px · z∈[-136,+110]mm · 8 of 42 slices shown (2 of 2)]
[im 1/42]
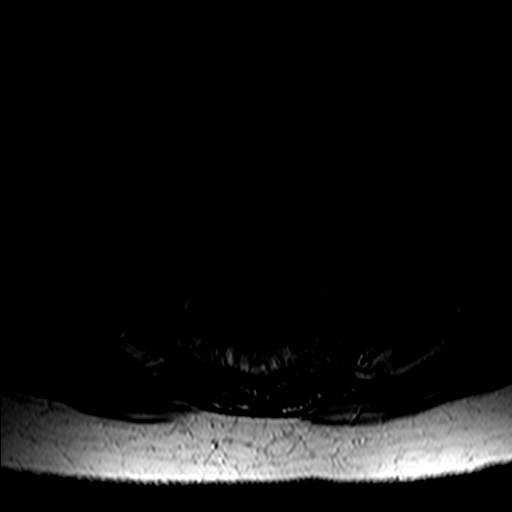
[im 5/42]
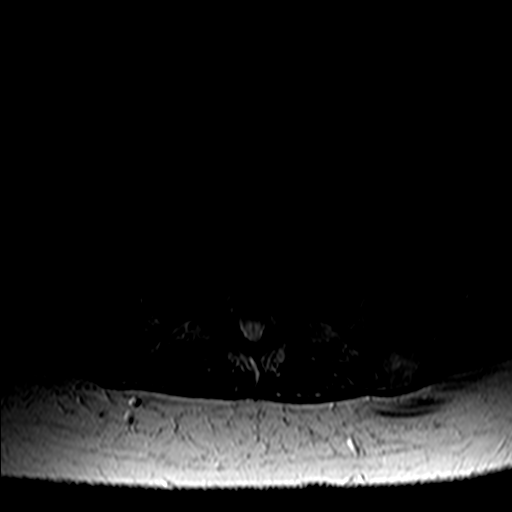
[im 14/42]
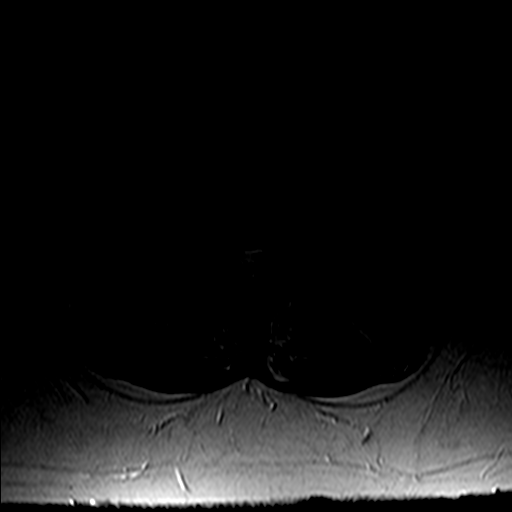
[im 19/42]
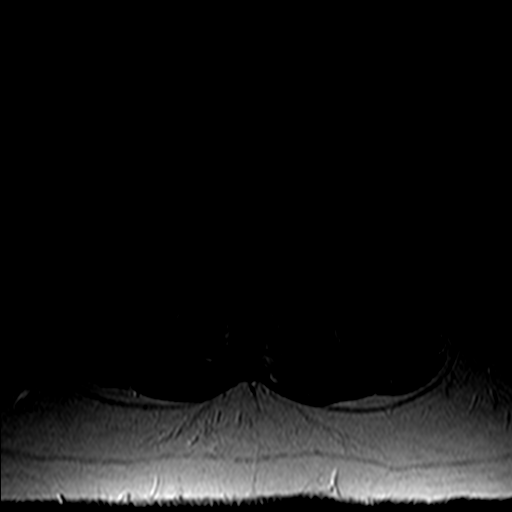
[im 23/42]
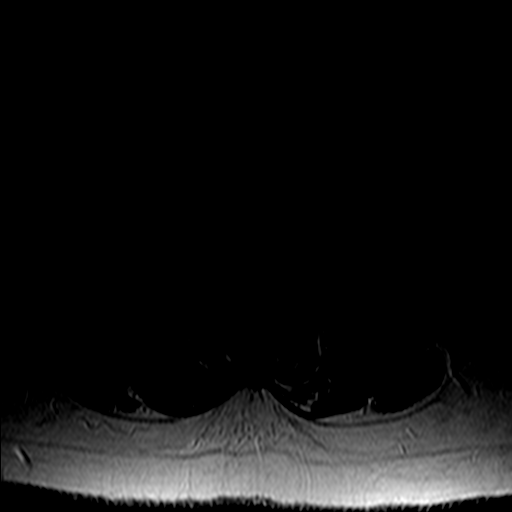
[im 28/42]
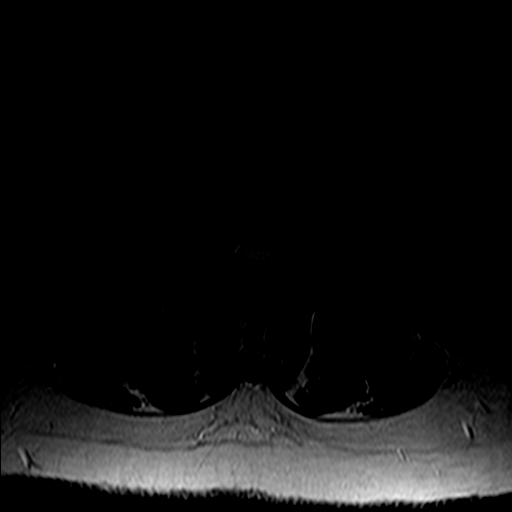
[im 37/42]
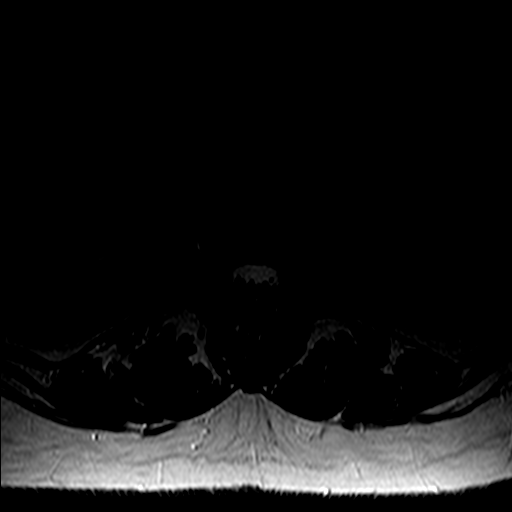
[im 42/42]
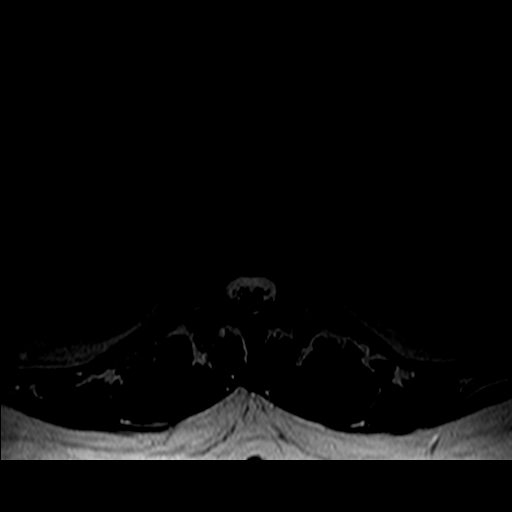

[Series 6: T1 · axial · 4.0mm · 0.39mm/px · z∈[-116,+84]mm · 3 of 42 slices shown (2 of 2)]
[im 5/42]
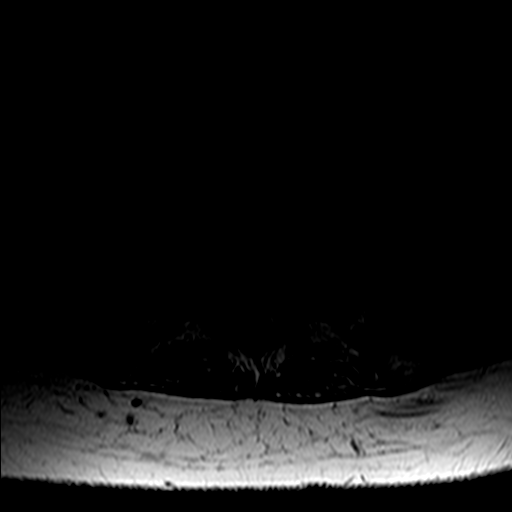
[im 23/42]
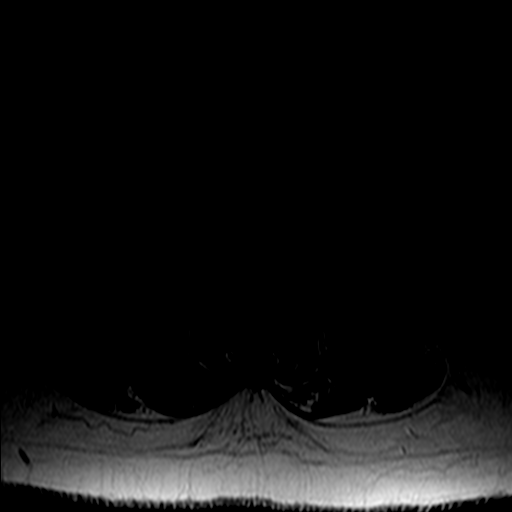
[im 37/42]
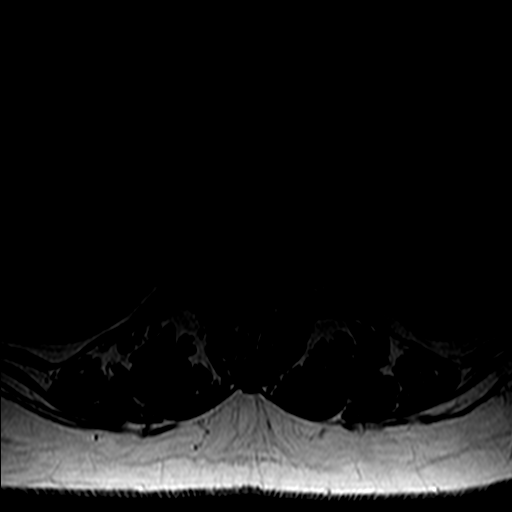

[19 of 48 positions shown; findings below may reference images not displayed]

FINDINGS: Segmentation:  Standard.

Alignment:  Physiologic.

Vertebrae:  No fracture, evidence of discitis, or bone lesion.

Conus medullaris: Extends to the T12 level and appears normal.

Paraspinal and other soft tissues: No paraspinal abnormality.

Disc levels:

Disc spaces: Degenerative disc disease with mild disc height loss at
L1-2.

T12-L1: No significant disc bulge. No evidence of neural foraminal
stenosis. No central canal stenosis.

L1-L2: Mild broad-based disc bulge. No evidence of neural foraminal
stenosis. No central canal stenosis.

L2-L3: Mild broad-based disc bulge. Mild bilateral facet
arthropathy. No evidence of neural foraminal stenosis. No central
canal stenosis.

L3-L4: Broad-based disc bulge. Mild bilateral facet arthropathy.
Mild bilateral lateral recess narrowing. Mild spinal stenosis. No
evidence of neural foraminal stenosis.

L4-L5: Broad-based disc bulge with a right paracentral annular
fissure. Mild bilateral facet arthropathy. Bilateral lateral recess
narrowing. No evidence of neural foraminal stenosis.

L5-S1: Broad-based disc bulge. No evidence of neural foraminal
stenosis. No central canal stenosis.
IMPRESSION: 1. At L3-4 there is a broad-based disc bulge. Mild bilateral facet
arthropathy. Mild bilateral lateral recess narrowing. Mild spinal
stenosis.
2. At L4-5 there is a broad-based disc bulge with a right
paracentral annular fissure. Mild bilateral facet arthropathy.
Bilateral lateral recess narrowing.

## 2018-05-02 ENCOUNTER — Ambulatory Visit
Admission: RE | Admit: 2018-05-02 | Discharge: 2018-05-02 | Disposition: A | Discharge status: Home / Self Care | Payer: PRIVATE HEALTH INSURANCE | Source: Ambulatory Visit | Attending: Internal Medicine | Admitting: Internal Medicine

## 2018-05-02 DIAGNOSIS — Z1231 Encounter for screening mammogram for malignant neoplasm of breast: Secondary | ICD-10-CM | POA: Insufficient documentation

## 2018-12-25 DIAGNOSIS — Z0000 Encounter for general adult medical examination without abnormal findings: Secondary | ICD-10-CM | POA: Insufficient documentation

## 2019-01-28 ENCOUNTER — Other Ambulatory Visit: Payer: Self-pay

## 2019-01-28 DIAGNOSIS — Z20822 Contact with and (suspected) exposure to covid-19: Secondary | ICD-10-CM

## 2019-01-29 LAB — NOVEL CORONAVIRUS, NAA: SARS-CoV-2, NAA: NOT DETECTED

## 2019-02-18 ENCOUNTER — Emergency Department
Admission: EM | Admit: 2019-02-18 | Discharge: 2019-02-18 | Disposition: A | Discharge status: Home / Self Care | Payer: PRIVATE HEALTH INSURANCE | Attending: Emergency Medicine | Admitting: Emergency Medicine

## 2019-02-18 ENCOUNTER — Other Ambulatory Visit: Payer: Self-pay

## 2019-02-18 DIAGNOSIS — I10 Essential (primary) hypertension: Secondary | ICD-10-CM

## 2019-02-18 DIAGNOSIS — Z87891 Personal history of nicotine dependence: Secondary | ICD-10-CM | POA: Insufficient documentation

## 2019-02-18 DIAGNOSIS — Z7984 Long term (current) use of oral hypoglycemic drugs: Secondary | ICD-10-CM | POA: Insufficient documentation

## 2019-02-18 DIAGNOSIS — Z79899 Other long term (current) drug therapy: Secondary | ICD-10-CM | POA: Diagnosis not present

## 2019-02-18 DIAGNOSIS — E119 Type 2 diabetes mellitus without complications: Secondary | ICD-10-CM | POA: Insufficient documentation

## 2019-02-18 DIAGNOSIS — E039 Hypothyroidism, unspecified: Secondary | ICD-10-CM | POA: Insufficient documentation

## 2019-02-18 LAB — URINALYSIS, COMPLETE (UACMP) WITH MICROSCOPIC
Bilirubin Urine: NEGATIVE
Glucose, UA: NEGATIVE mg/dL
Hgb urine dipstick: NEGATIVE
Ketones, ur: NEGATIVE mg/dL
Leukocytes,Ua: NEGATIVE
Nitrite: NEGATIVE
Protein, ur: NEGATIVE mg/dL
Specific Gravity, Urine: 1.002 — ABNORMAL LOW (ref 1.005–1.030)
pH: 7 (ref 5.0–8.0)

## 2019-02-18 LAB — CBC
HCT: 43.7 % (ref 36.0–46.0)
Hemoglobin: 14.8 g/dL (ref 12.0–15.0)
MCH: 29.8 pg (ref 26.0–34.0)
MCHC: 33.9 g/dL (ref 30.0–36.0)
MCV: 87.9 fL (ref 80.0–100.0)
Platelets: 260 10*3/uL (ref 150–400)
RBC: 4.97 MIL/uL (ref 3.87–5.11)
RDW: 12.9 % (ref 11.5–15.5)
WBC: 6.7 10*3/uL (ref 4.0–10.5)
nRBC: 0 % (ref 0.0–0.2)

## 2019-02-18 LAB — BASIC METABOLIC PANEL
Anion gap: 10 (ref 5–15)
BUN: 15 mg/dL (ref 6–20)
CO2: 26 mmol/L (ref 22–32)
Calcium: 9.5 mg/dL (ref 8.9–10.3)
Chloride: 101 mmol/L (ref 98–111)
Creatinine, Ser: 0.74 mg/dL (ref 0.44–1.00)
GFR calc Af Amer: >60 mL/min (ref >60)
GFR calc non Af Amer: >60 mL/min (ref >60)
Glucose, Bld: 109 mg/dL — ABNORMAL HIGH (ref 70–99)
Potassium: 4.3 mmol/L (ref 3.5–5.1)
Sodium: 137 mmol/L (ref 135–145)

## 2019-02-18 LAB — TSH: TSH: 3.832 u[IU]/mL (ref 0.350–4.500)

## 2019-02-18 LAB — PREGNANCY, URINE: Preg Test, Ur: NEGATIVE

## 2019-02-18 LAB — T4, FREE: Free T4: 0.72 ng/dL (ref 0.61–1.12)

## 2019-02-18 NOTE — ED Provider Notes (Signed)
[facility] Emergency Department Provider Note  Time seen: 1:45 PM  I have reviewed the triage vital signs and the nursing notes.   HISTORY  Chief Complaint Hypertension   HPI Makayla Villarreal is a 58 y.o. female with a past medical history of diabetes, PAD, presents to the emergency department for concerns of her hypertension.  According to the patient several weeks ago she accidentally took a higher dose of Synthroid than she is supposed to for approximately 2 to 3 weeks.  States she began feeling her heart racing and became very tremulous.  Patient states she saw her doctor who then decreased her dose and she began feeling better.  Patient states however over the past few days she has intermittently felt tremulous and checked her blood pressure and it had spiked as high as 160/120.  Today patient had a similar episode while she was at work so she came to the emergency department for evaluation.  Denies any chest pain shortness of breath or fever.  Patient states currently she denies any symptoms at all and her blood pressure upon arrival is 129/81.   Past Medical History:  Diagnosis Date  . Diabetes mellitus without complication (HCC)   . Peripheral arterial disease [facility])     Patient Active Problem List   Diagnosis Date Noted  . Venous insufficiency of both lower extremities 07/21/2016  . Diabetes (HCC) 04/18/2016  . Pain in limb 04/18/2016  . Swelling of limb 04/18/2016  . Varicose veins of leg with pain, bilateral 04/18/2016    Past Surgical History:  Procedure Laterality Date  . CESAREAN SECTION     twice  . HERNIA REPAIR    . KNEE CARTILAGE SURGERY      Prior to Admission medications   Medication Sig Start Date End Date Taking? Authorizing Provider  albuterol (PROVENTIL HFA) 108 (90 Base) MCG/ACT inhaler 2 puffs q.i.d. p.r.n. short of breath, wheezing, or cough 01/10/17   [provider]  ALPRAZolam (XANAX) 0.5 MG tablet take 1 tablet by  mouth ONE HOUR PRIOR TO PROCEDURE AND 1 TABLET WHEN YOU ARRIVE AT THE OFFICE 10/23/16   [provider]  Ascorbic Acid (VITAMIN C) 1000 MG tablet Take by mouth.    [provider]  diazepam (VALIUM) 2 MG tablet take 1 tablet by mouth at bedtime if needed for anxiety 10/10/16   [provider]  fluticasone (FLONASE) 50 MCG/ACT nasal spray Place into the nose. 07/08/14   [provider]  Garlic 1000 MG CAPS Take by mouth.    [provider]  ibuprofen (ADVIL,MOTRIN) 200 MG tablet Take by mouth.    [provider]  Loratadine 10 MG CAPS Take by mouth.    [provider]  magnesium oxide (MAG-OX) 400 MG tablet Take by mouth.    [provider]  metFORMIN (GLUCOPHAGE) 500 MG tablet take 1 tablet by mouth twice a day with meals 03/16/16   [provider]  thyroid (ARMOUR THYROID) 120 MG tablet Take by mouth. 03/14/16   [provider]  Vitamin D, Ergocalciferol, (DRISDOL) 50000 units CAPS capsule take 1 capsule by mouth every week 03/30/17   [provider]  vitamin E 400 UNIT capsule Take by mouth.    [provider]    Allergies  Allergen Reactions  . Codeine Nausea Only    Other reaction(s): Hallucination  . Etodolac     Other reaction(s): Other (See Comments) Swelling/stomach issues with burning    Family  History  Problem Relation Age of Onset  . Diabetes Mother   . Hyperlipidemia Mother   . Varicose Veins Mother   . Varicose Veins Maternal Grandfather   . Breast cancer Maternal Aunt 65  . Breast cancer Paternal Aunt        postmenopausal   . Breast cancer Paternal Aunt        postmeno    Social History Social History   Tobacco Use  . Smoking status: Former Smoker    Quit date: 2005    Years since quitting: 15.8  . Smokeless tobacco: Never Used  Substance Use Topics  . Alcohol use: Yes  . Drug use: No    Review of Systems Constitutional: Negative for  fever. Cardiovascular: Negative for chest pain. Respiratory: Negative for shortness of breath. Gastrointestinal: Negative for abdominal pain Musculoskeletal: Negative for musculoskeletal complaints Skin: Negative for skin complaints  Neurological: Negative for headache All other ROS negative  ____________________________________________   PHYSICAL EXAM:  VITAL SIGNS: ED Triage Vitals [02/18/19 1151]  Enc Vitals Group     BP 129/81     Pulse Rate (!) 108     Resp 20     Temp 98.1 F (36.7 C)     Temp Source Oral     SpO2 100 %     Weight 160 lb (72.6 kg)     Height 5\' 9"  (1.753 m)     Head Circumference      Peak Flow      Pain Score 0     Pain Loc      Pain Edu?      Excl. in Center?    Constitutional: Alert and oriented. Well appearing and in no distress. Eyes: Normal exam ENT      Head: Normocephalic and atraumatic.      Mouth/Throat: Mucous membranes are moist. Cardiovascular: Normal rate, regular rhythm Respiratory: Normal respiratory effort without tachypnea nor retractions. Breath sounds are clear Gastrointestinal: Soft and nontender. No distention.  Musculoskeletal: Nontender with normal range of motion in all extremities. Neurologic:  Normal speech and language. No gross focal neurologic deficits Skin:  Skin is warm, dry and intact.  Psychiatric: Mood and affect are normal.   ____________________________________________    EKG  EKG viewed and interpreted by myself shows a normal sinus rhythm at 93 bpm with a narrow QRS, normal axis, normal intervals, no concerning ST changes.  ____________________________________________    INITIAL IMPRESSION / ASSESSMENT AND PLAN / ED COURSE  Pertinent labs & imaging results that were available during my care of the patient were reviewed by me and considered in my medical decision making (see chart for details).   Patient presents emergency department for issues of hypertension as high as 160/120 per patient.  States  she will also become tremulous and sweaty.  States after the episode passes she feels completely wiped out.  Patient's labs are largely reassuring including her TSH and free T4.  Patient's blood pressure is 129/81 in the emergency department.  Given these findings especially with the tremulousness sweatiness and feeling wiped out after the episodes I did discuss the possibility of panic disorder and anxiety attack.  Patient does not believe that is the cause of her symptoms, she does have Valium which she can take as needed at home, states she only takes this once or twice per year.  I did discuss with the patient the next time this occurs to try one of her Valium tablets to see if it  helps with her symptoms.  I also discussed following up with her PCP Dr. Dareen PianoAnderson to discuss any further work-up such as a 24-hour urine collection for catecholamine testing.  Overall the patient appears well with a reassuring work-up reassuring physical exam and I believe she is safe for discharge home at this time.  Makayla Villarreal was evaluated in Emergency Department on 02/18/2019 for the symptoms described in the history of present illness. She was evaluated in the context of the global COVID-19 pandemic, which necessitated consideration that the patient might be at risk for infection with the SARS-CoV-2 virus that causes COVID-19. Institutional protocols and algorithms that pertain to the evaluation of patients at risk for COVID-19 are in a state of rapid change based on information released by regulatory bodies including the CDC and federal and state organizations. These policies and algorithms were followed during the patient's care in the ED.  ____________________________________________   FINAL CLINICAL IMPRESSION(S) / ED DIAGNOSES  Hypertension   Minna AntisPaduchowski, Eulonda Andalon, MD 02/18/19 1349

## 2019-02-18 NOTE — ED Triage Notes (Signed)
Says he bp is up.  Feel it is related to thyroid med.

## 2019-02-18 NOTE — ED Triage Notes (Addendum)
Pt states that she is here due to recurrent hypertension, states that is occurred X 1 yesterday and again today. States that when BP is up, she feels shaky and dizzy. States that she feels that BP has come down now but does she is afraid that when her BP goes up, "something is going to happen to me". Pt alert and oriented X4, cooperative, RR even and unlabored, color WNL. Pt in NAD. Reports of hypertension systolic 352'Y and last diastolic 818 today.

## 2019-05-08 ENCOUNTER — Other Ambulatory Visit: Payer: Self-pay | Admitting: Internal Medicine

## 2019-05-09 ENCOUNTER — Other Ambulatory Visit: Payer: Self-pay | Admitting: Internal Medicine

## 2019-05-09 DIAGNOSIS — N63 Unspecified lump in unspecified breast: Secondary | ICD-10-CM

## 2019-05-15 ENCOUNTER — Other Ambulatory Visit: Payer: PRIVATE HEALTH INSURANCE

## 2019-05-15 ENCOUNTER — Ambulatory Visit: Payer: PRIVATE HEALTH INSURANCE | Attending: Internal Medicine

## 2019-05-15 DIAGNOSIS — Z20822 Contact with and (suspected) exposure to covid-19: Secondary | ICD-10-CM

## 2019-05-17 LAB — NOVEL CORONAVIRUS, NAA: SARS-CoV-2, NAA: NOT DETECTED

## 2019-05-22 ENCOUNTER — Ambulatory Visit
Admission: RE | Admit: 2019-05-22 | Discharge: 2019-05-22 | Disposition: A | Discharge status: Home / Self Care | Payer: PRIVATE HEALTH INSURANCE | Source: Ambulatory Visit | Attending: Internal Medicine | Admitting: Internal Medicine

## 2019-05-22 DIAGNOSIS — N6312 Unspecified lump in the right breast, upper inner quadrant: Secondary | ICD-10-CM | POA: Insufficient documentation

## 2019-05-22 DIAGNOSIS — N63 Unspecified lump in unspecified breast: Secondary | ICD-10-CM

## 2019-05-29 ENCOUNTER — Other Ambulatory Visit: Payer: PRIVATE HEALTH INSURANCE

## 2019-07-06 ENCOUNTER — Ambulatory Visit: Payer: PRIVATE HEALTH INSURANCE | Attending: Internal Medicine

## 2019-07-06 DIAGNOSIS — Z23 Encounter for immunization: Secondary | ICD-10-CM | POA: Insufficient documentation

## 2019-07-06 NOTE — Progress Notes (Signed)
   Covid-19 Vaccination Clinic  Name:  Kobie Matkins    MRN: [id number] DOB: 1960-11-22  07/06/2019  Ms. Mccombs was observed post Covid-19 immunization for 15 minutes without incident. She was provided with Vaccine Information Sheet and instruction to access the V-Safe system.   Ms. Anzaldo was instructed to call 911 with any severe reactions post vaccine: Marland Kitchen Difficulty breathing  . Swelling of face and throat  . A fast heartbeat  . A bad rash all over body  . Dizziness and weakness   Immunizations Administered    Name Date Dose VIS Date Route   Pfizer COVID-19 Vaccine 07/06/2019  9:24 AM 0.3 mL 04/11/2019 Intramuscular   Manufacturer: [employer], [employer]   Lot: [id number]   NDC: [address]-2

## 2019-07-29 ENCOUNTER — Ambulatory Visit: Payer: PRIVATE HEALTH INSURANCE | Attending: Internal Medicine

## 2019-07-29 DIAGNOSIS — Z23 Encounter for immunization: Secondary | ICD-10-CM

## 2019-07-29 NOTE — Progress Notes (Signed)
   Covid-19 Vaccination Clinic  Name:  Makayla Villarreal    MRN: [id number] DOB: [birth year]  07/29/2019  [name] was observed post Covid-19 immunization for 15 minutes without incident. She was provided with Vaccine Information Sheet and instruction to access the V-Safe system.   [name] was instructed to call 911 with any severe reactions post vaccine: Marland Kitchen Difficulty breathing  . Swelling of face and throat  . A fast heartbeat  . A bad rash all over body  . Dizziness and weakness   Immunizations Administered    Name Date Dose VIS Date Route   Pfizer COVID-19 Vaccine 07/29/2019  4:05 PM 0.3 mL 04/11/2019 Intramuscular   Manufacturer: [employer], [employer]   Lot: [id number]   NDC: [address]-5

## 2019-08-04 DIAGNOSIS — R002 Palpitations: Secondary | ICD-10-CM | POA: Insufficient documentation

## 2019-09-30 ENCOUNTER — Telehealth (INDEPENDENT_AMBULATORY_CARE_PROVIDER_SITE_OTHER): Payer: Self-pay | Admitting: Vascular Surgery

## 2019-09-30 NOTE — Telephone Encounter (Signed)
The pt called back and  Said that her pain started 3-4 months ago with an area of bludging blue veins in her left leg that causes cramp and she also has the same cramps in her right leg she has no Hx of blood clots neither leg is feverish but she does has some bluish color  Were the bludging veins are.  The pt has numbness down the front left leg and toes on the left foot she express no difficulty moving  Her legs

## 2019-09-30 NOTE — Telephone Encounter (Signed)
Bring her in with ABI and Bilateral venous reflux...JD or me per pt preference

## 2019-09-30 NOTE — Telephone Encounter (Signed)
I  called an left a Voicemail for the patient to call back so I can get more information from her on her condition .

## 2019-09-30 NOTE — Telephone Encounter (Signed)
Called stating she has been having severe leg cramps for the past 3-5 months she also has bulging veins and are painful as well. She would like to come in to be seen. She was last seen by JD 06/26/17 with a F/U no studies. Please advise.

## 2019-10-01 NOTE — Telephone Encounter (Signed)
Can you you schedule the pt for the following per the NP's request

## 2019-10-13 ENCOUNTER — Other Ambulatory Visit (INDEPENDENT_AMBULATORY_CARE_PROVIDER_SITE_OTHER): Payer: Self-pay | Admitting: Nurse Practitioner

## 2019-10-13 ENCOUNTER — Other Ambulatory Visit (INDEPENDENT_AMBULATORY_CARE_PROVIDER_SITE_OTHER): Payer: Self-pay | Admitting: Physician Assistant

## 2019-10-13 DIAGNOSIS — R200 Anesthesia of skin: Secondary | ICD-10-CM

## 2019-10-13 DIAGNOSIS — R252 Cramp and spasm: Secondary | ICD-10-CM

## 2019-10-13 DIAGNOSIS — I83813 Varicose veins of bilateral lower extremities with pain: Secondary | ICD-10-CM

## 2019-10-14 ENCOUNTER — Ambulatory Visit (INDEPENDENT_AMBULATORY_CARE_PROVIDER_SITE_OTHER): Payer: PRIVATE HEALTH INSURANCE | Admitting: Nurse Practitioner

## 2019-10-14 ENCOUNTER — Ambulatory Visit (INDEPENDENT_AMBULATORY_CARE_PROVIDER_SITE_OTHER): Payer: PRIVATE HEALTH INSURANCE

## 2019-10-14 ENCOUNTER — Other Ambulatory Visit: Payer: Self-pay

## 2019-10-14 ENCOUNTER — Encounter (INDEPENDENT_AMBULATORY_CARE_PROVIDER_SITE_OTHER): Payer: Self-pay | Admitting: Nurse Practitioner

## 2019-10-14 VITALS — BP 123/81 | HR 68 | Resp 16 | Wt 168.8 lb

## 2019-10-14 DIAGNOSIS — M5416 Radiculopathy, lumbar region: Secondary | ICD-10-CM | POA: Diagnosis not present

## 2019-10-14 DIAGNOSIS — R200 Anesthesia of skin: Secondary | ICD-10-CM

## 2019-10-14 DIAGNOSIS — R252 Cramp and spasm: Secondary | ICD-10-CM | POA: Diagnosis not present

## 2019-10-14 DIAGNOSIS — I83813 Varicose veins of bilateral lower extremities with pain: Secondary | ICD-10-CM | POA: Diagnosis not present

## 2019-10-16 ENCOUNTER — Encounter (INDEPENDENT_AMBULATORY_CARE_PROVIDER_SITE_OTHER): Payer: Self-pay | Admitting: Nurse Practitioner

## 2019-10-16 NOTE — Progress Notes (Signed)
Subjective:    Patient ID: Makayla Villarreal, female    DOB: March 24, 1961, 59 y.o.   MRN: [id number] Chief Complaint  Patient presents with  . Follow-up    ultrasound follow up    Is seen in the office today with complaints of left lower extremity leg pain.  The patient returns to the office for followup status post laser ablation of the bilateral great saphenous veins in 2018.  The patient did not undergo sclerotherapy at that time.  The patient noted about 3 to 4 months ago she began to have pain in her left leg and calves.  And it has progressed to all over her leg.  She also started to have leg cramping and after a particularly intense leg cramps she noticed a large varicosity in her medial thigh area.  She describes the varicosity as being tender to palpation her pain dependent positions.  The patient has been wearing medical grade 1 compression stockings and it helps minimally.  Elevation does help minimally as well.  Over-the-counter pain medication has been minimally helpful.  Today noninvasive studies show an ABI of 1.12 on the right and 1.08 on the left.  She has strong triphasic tibial artery waveforms bilaterally with strong toe waveforms bilaterally.  The patient also underwent a bilateral lower extremity venous reflux study which revealed that there is no evidence of DVT or superficial venous stenosis bilaterally.  No evidence of deep venous insufficiency bilaterally.  There is no evidence of superficial venous reflux seen in the bilateral great or short saphenous veins.  The prior great saphenous vein ablations from 2018 show is being intact.  The left lower extremity however does note an anterior accessory originating from the saphenofemoral junction that extends to the mid thigh.   Review of Systems  Cardiovascular: Positive for leg swelling.  Musculoskeletal:       Aching throbbing in legs  All other systems reviewed and are negative.      Objective:   Physical Exam Vitals  reviewed.  HENT:     Head: Normocephalic.  Cardiovascular:     Rate and Rhythm: Normal rate and regular rhythm.     Pulses: Normal pulses.     Heart sounds: Normal heart sounds.     Comments: Large varicosity extending in left lower extremity. Neurological:     Mental Status: She is alert and oriented to person, place, and time.  Psychiatric:        Mood and Affect: Mood normal.        Behavior: Behavior normal.        Thought Content: Thought content normal.        Judgment: Judgment normal.     BP 123/81 (BP Location: Right Arm)   Pulse 68   Resp 16   Wt 168 lb 12.8 oz (76.6 kg)   BMI 24.93 kg/m   Past Medical History:  Diagnosis Date  . Diabetes mellitus without complication (HCC)   . Peripheral arterial disease (HCC)     Social History   Socioeconomic History  . Marital status: Married    Spouse name: Not on file  . Number of children: Not on file  . Years of education: Not on file  . Highest education level: Not on file  Occupational History  . Not on file  Tobacco Use  . Smoking status: Former Smoker    Quit date: 2005    Years since quitting: 16.4  . Smokeless tobacco: Never Used  Substance and  Sexual Activity  . Alcohol use: Yes  . Drug use: No  . Sexual activity: Not on file  Other Topics Concern  . Not on file  Social History Narrative  . Not on file   Social Determinants of Health   Financial Resource Strain:   . Difficulty of Paying Living Expenses:   Food Insecurity:   . Worried About Charity fundraiser in the Last Year:   . Arboriculturist in the Last Year:   Transportation Needs:   . Film/video editor (Medical):   Marland Kitchen Lack of Transportation (Non-Medical):   Physical Activity:   . Days of Exercise per Week:   . Minutes of Exercise per Session:   Stress:   . Feeling of Stress :   Social Connections:   . Frequency of Communication with Friends and Family:   . Frequency of Social Gatherings with Friends and Family:   . Attends  Religious Services:   . Active Member of Clubs or Organizations:   . Attends Archivist Meetings:   Marland Kitchen Marital Status:   Intimate Partner Violence:   . Fear of Current or Ex-Partner:   . Emotionally Abused:   Marland Kitchen Physically Abused:   . Sexually Abused:     Past Surgical History:  Procedure Laterality Date  . CESAREAN SECTION     twice  . HERNIA REPAIR    . KNEE CARTILAGE SURGERY      Family History  Problem Relation Age of Onset  . Diabetes Mother   . Hyperlipidemia Mother   . Varicose Veins Mother   . Varicose Veins Maternal Grandfather   . Breast cancer Maternal Aunt 65  . Breast cancer Paternal Aunt        postmenopausal   . Breast cancer Paternal Aunt        postmeno    Allergies  Allergen Reactions  . Codeine Nausea Only    Other reaction(s): Hallucination  . Etodolac     Other reaction(s): Other (See Comments) Swelling/stomach issues with burning       Assessment & Plan:   1. Varicose veins of leg with pain, bilateral Recommend:  The patient has had successful ablation of the previously incompetent saphenous venous system but still has persistent symptoms of pain and swelling that are having a negative impact on daily life and daily activities.  Patient should undergo injection foam sclerotherapy to treat the residual varicosities of left lower extremity.  The risks, benefits and alternative therapies were reviewed in detail with the patient.  All questions were answered.  The patient agrees to proceed with sclerotherapy at their convenience.  The patient will continue wearing the graduated compression stockings and using the over-the-counter pain medications to treat her symptoms.       2. Lumbar radiculopathy, chronic This could also account for some of her pain and discomfort.  If symptoms persist following sclerotherapy, patient should follow-up with PCP for further examination.   Current Outpatient Medications on File Prior to Visit    Medication Sig Dispense Refill  . albuterol (PROVENTIL HFA) 108 (90 Base) MCG/ACT inhaler 2 puffs q.i.d. p.r.n. short of breath, wheezing, or cough    . Ascorbic Acid (VITAMIN C) 1000 MG tablet Take by mouth.    . diazepam (VALIUM) 2 MG tablet take 1 tablet by mouth at bedtime if needed for anxiety  0  . fluticasone (FLONASE) 50 MCG/ACT nasal spray Place into the nose.    . Garlic 9518 MG CAPS  Take by mouth.    Marland Kitchen ibuprofen (ADVIL,MOTRIN) 200 MG tablet Take by mouth.    . levothyroxine (SYNTHROID) 125 MCG tablet Take 125 mcg by mouth daily before breakfast.    . Loratadine 10 MG CAPS Take by mouth.    . magnesium oxide (MAG-OX) 400 MG tablet Take by mouth.    . metFORMIN (GLUCOPHAGE) 500 MG tablet take 1 tablet by mouth twice a day with meals    . Vitamin D, Ergocalciferol, (DRISDOL) 50000 units CAPS capsule take 1 capsule by mouth every week    . vitamin E 400 UNIT capsule Take by mouth.    . ALPRAZolam (XANAX) 0.5 MG tablet take 1 tablet by mouth ONE HOUR PRIOR TO PROCEDURE AND 1 TABLET WHEN YOU ARRIVE AT THE OFFICE (Patient not taking: Reported on 10/14/2019)  0  . thyroid (ARMOUR THYROID) 120 MG tablet Take by mouth. (Patient not taking: Reported on 10/14/2019)     No current facility-administered medications on file prior to visit.    There are no Patient Instructions on file for this visit. No follow-ups on file.   Georgiana Spinner, NP

## 2019-10-22 ENCOUNTER — Encounter (INDEPENDENT_AMBULATORY_CARE_PROVIDER_SITE_OTHER): Payer: PRIVATE HEALTH INSURANCE

## 2019-10-22 ENCOUNTER — Ambulatory Visit (INDEPENDENT_AMBULATORY_CARE_PROVIDER_SITE_OTHER): Payer: PRIVATE HEALTH INSURANCE | Admitting: Nurse Practitioner

## 2019-12-16 ENCOUNTER — Ambulatory Visit (INDEPENDENT_AMBULATORY_CARE_PROVIDER_SITE_OTHER): Payer: PRIVATE HEALTH INSURANCE | Admitting: Vascular Surgery

## 2020-01-06 ENCOUNTER — Ambulatory Visit (INDEPENDENT_AMBULATORY_CARE_PROVIDER_SITE_OTHER): Payer: PRIVATE HEALTH INSURANCE | Admitting: Vascular Surgery

## 2020-01-27 ENCOUNTER — Ambulatory Visit (INDEPENDENT_AMBULATORY_CARE_PROVIDER_SITE_OTHER): Payer: PRIVATE HEALTH INSURANCE | Admitting: Vascular Surgery

## 2021-06-20 ENCOUNTER — Other Ambulatory Visit: Payer: Self-pay | Admitting: Orthopedic Surgery

## 2021-06-20 ENCOUNTER — Other Ambulatory Visit (HOSPITAL_COMMUNITY): Payer: Self-pay | Admitting: Orthopedic Surgery

## 2021-06-20 DIAGNOSIS — M5416 Radiculopathy, lumbar region: Secondary | ICD-10-CM

## 2022-01-12 ENCOUNTER — Other Ambulatory Visit: Payer: Self-pay | Admitting: Internal Medicine

## 2022-01-12 DIAGNOSIS — Z1231 Encounter for screening mammogram for malignant neoplasm of breast: Secondary | ICD-10-CM

## 2022-02-27 ENCOUNTER — Encounter (INDEPENDENT_AMBULATORY_CARE_PROVIDER_SITE_OTHER): Payer: Self-pay

## 2022-04-18 ENCOUNTER — Other Ambulatory Visit: Payer: Self-pay | Admitting: Orthopedic Surgery

## 2022-04-18 DIAGNOSIS — M5412 Radiculopathy, cervical region: Secondary | ICD-10-CM

## 2022-04-21 ENCOUNTER — Ambulatory Visit
Admission: RE | Admit: 2022-04-21 | Discharge: 2022-04-21 | Disposition: A | Discharge status: Home / Self Care | Payer: No Typology Code available for payment source | Source: Ambulatory Visit | Attending: Orthopedic Surgery | Admitting: Orthopedic Surgery

## 2022-04-21 DIAGNOSIS — M5412 Radiculopathy, cervical region: Secondary | ICD-10-CM | POA: Diagnosis present

## 2022-05-23 ENCOUNTER — Encounter: Payer: Self-pay | Admitting: Emergency Medicine

## 2022-05-23 ENCOUNTER — Inpatient Hospital Stay
Admit: 2022-05-23 | Discharge: 2022-05-23 | Disposition: A | Discharge status: Home / Self Care | Payer: No Typology Code available for payment source | Attending: Internal Medicine | Admitting: Internal Medicine

## 2022-05-23 ENCOUNTER — Other Ambulatory Visit: Payer: Self-pay

## 2022-05-23 ENCOUNTER — Observation Stay
Admission: EM | Admit: 2022-05-23 | Discharge: 2022-05-25 | Disposition: A | Discharge status: Home / Self Care | Payer: No Typology Code available for payment source | Attending: Internal Medicine | Admitting: Internal Medicine

## 2022-05-23 ENCOUNTER — Inpatient Hospital Stay: Payer: No Typology Code available for payment source

## 2022-05-23 ENCOUNTER — Emergency Department: Payer: No Typology Code available for payment source

## 2022-05-23 DIAGNOSIS — R911 Solitary pulmonary nodule: Secondary | ICD-10-CM | POA: Insufficient documentation

## 2022-05-23 DIAGNOSIS — Z87891 Personal history of nicotine dependence: Secondary | ICD-10-CM | POA: Insufficient documentation

## 2022-05-23 DIAGNOSIS — E119 Type 2 diabetes mellitus without complications: Secondary | ICD-10-CM

## 2022-05-23 DIAGNOSIS — R079 Chest pain, unspecified: Secondary | ICD-10-CM | POA: Diagnosis present

## 2022-05-23 DIAGNOSIS — M79609 Pain in unspecified limb: Secondary | ICD-10-CM | POA: Diagnosis present

## 2022-05-23 DIAGNOSIS — F419 Anxiety disorder, unspecified: Secondary | ICD-10-CM | POA: Diagnosis present

## 2022-05-23 DIAGNOSIS — Z79899 Other long term (current) drug therapy: Secondary | ICD-10-CM | POA: Diagnosis not present

## 2022-05-23 DIAGNOSIS — Z7984 Long term (current) use of oral hypoglycemic drugs: Secondary | ICD-10-CM | POA: Insufficient documentation

## 2022-05-23 DIAGNOSIS — M7989 Other specified soft tissue disorders: Secondary | ICD-10-CM | POA: Diagnosis present

## 2022-05-23 DIAGNOSIS — E039 Hypothyroidism, unspecified: Secondary | ICD-10-CM | POA: Diagnosis not present

## 2022-05-23 DIAGNOSIS — I2699 Other pulmonary embolism without acute cor pulmonale: Principal | ICD-10-CM | POA: Insufficient documentation

## 2022-05-23 LAB — CBG MONITORING, ED: Glucose-Capillary: 94 mg/dL (ref 70–99)

## 2022-05-23 LAB — BASIC METABOLIC PANEL
Anion gap: 9 (ref 5–15)
BUN: 11 mg/dL (ref 6–20)
CO2: 27 mmol/L (ref 22–32)
Calcium: 9.1 mg/dL (ref 8.9–10.3)
Chloride: 102 mmol/L (ref 98–111)
Creatinine, Ser: 0.75 mg/dL (ref 0.44–1.00)
GFR, Estimated: >60 mL/min (ref >60)
Glucose, Bld: 168 mg/dL — ABNORMAL HIGH (ref 70–99)
Potassium: 3.9 mmol/L (ref 3.5–5.1)
Sodium: 138 mmol/L (ref 135–145)

## 2022-05-23 LAB — PROTIME-INR
INR: 1 (ref 0.8–1.2)
Prothrombin Time: 13.4 seconds (ref 11.4–15.2)

## 2022-05-23 LAB — CBC
HCT: 41.9 % (ref 36.0–46.0)
Hemoglobin: 13.5 g/dL (ref 12.0–15.0)
MCH: 29.4 pg (ref 26.0–34.0)
MCHC: 32.2 g/dL (ref 30.0–36.0)
MCV: 91.3 fL (ref 80.0–100.0)
Platelets: 247 10*3/uL (ref 150–400)
RBC: 4.59 MIL/uL (ref 3.87–5.11)
RDW: 13.2 % (ref 11.5–15.5)
WBC: 7.8 10*3/uL (ref 4.0–10.5)
nRBC: 0 % (ref 0.0–0.2)

## 2022-05-23 LAB — APTT: aPTT: 28 seconds (ref 24–36)

## 2022-05-23 LAB — TROPONIN I (HIGH SENSITIVITY)
Troponin I (High Sensitivity): 4 ng/L (ref <18)
Troponin I (High Sensitivity): 4 ng/L (ref <18)

## 2022-05-23 MED ORDER — INSULIN ASPART 100 UNIT/ML IJ SOLN: 0.0000 [IU] | Freq: Three times a day (TID) | INTRAMUSCULAR | Status: DC

## 2022-05-23 MED ORDER — HEPARIN (PORCINE) 25000 UT/250ML-% IV SOLN
1150.0000 [IU]/h | INTRAVENOUS | Status: DC
  Administered 2022-05-23: 1250 [IU]/h via INTRAVENOUS
  Administered 2022-05-24: 1150 [IU]/h via INTRAVENOUS
  Filled 2022-05-23 (×2): qty 250

## 2022-05-23 MED ORDER — ONDANSETRON HCL 4 MG/2ML IJ SOLN: 4.0000 mg | Freq: Four times a day (QID) | INTRAMUSCULAR | Status: DC | PRN

## 2022-05-23 MED ORDER — SENNOSIDES-DOCUSATE SODIUM 8.6-50 MG PO TABS: 1.0000 | ORAL_TABLET | Freq: Every evening | ORAL | Status: DC | PRN

## 2022-05-23 MED ORDER — HEPARIN BOLUS VIA INFUSION
4700.0000 [IU] | Freq: Once | INTRAVENOUS | Status: AC
  Administered 2022-05-23: 4700 [IU] via INTRAVENOUS
  Filled 2022-05-23: qty 4700

## 2022-05-23 MED ORDER — LEVOTHYROXINE SODIUM 25 MCG PO TABS: 125.0000 ug | ORAL_TABLET | Freq: Every day | ORAL | Status: DC

## 2022-05-23 MED ORDER — DIAZEPAM 2 MG PO TABS: 2.0000 mg | ORAL_TABLET | Freq: Every evening | ORAL | Status: DC | PRN

## 2022-05-23 MED ORDER — ACETAMINOPHEN 650 MG RE SUPP: 650.0000 mg | Freq: Four times a day (QID) | RECTAL | Status: DC | PRN

## 2022-05-23 MED ORDER — IOHEXOL 350 MG/ML SOLN
75.0000 mL | Freq: Once | INTRAVENOUS | Status: AC | PRN
  Administered 2022-05-23: 75 mL via INTRAVENOUS

## 2022-05-23 MED ORDER — INSULIN ASPART 100 UNIT/ML IJ SOLN: 0.0000 [IU] | Freq: Every day | INTRAMUSCULAR | Status: DC

## 2022-05-23 MED ORDER — LEVOTHYROXINE SODIUM 112 MCG PO TABS
112.0000 ug | ORAL_TABLET | Freq: Every day | ORAL | Status: DC
  Administered 2022-05-24 – 2022-05-25 (×2): 112 ug via ORAL
  Filled 2022-05-23 (×2): qty 1

## 2022-05-23 MED ORDER — ACETAMINOPHEN 325 MG PO TABS: 650.0000 mg | ORAL_TABLET | Freq: Four times a day (QID) | ORAL | Status: DC | PRN

## 2022-05-23 MED ORDER — ONDANSETRON HCL 4 MG PO TABS: 4.0000 mg | ORAL_TABLET | Freq: Four times a day (QID) | ORAL | Status: DC | PRN

## 2022-05-23 NOTE — Assessment & Plan Note (Signed)
-  Resumed home levothyroxine 112 mcg daily resumed

## 2022-05-23 NOTE — ED Triage Notes (Addendum)
First Nurse Note;  Pt via [facility] from [facility]. Pt c/o L sided chest pain for the past couple of nights. Denies SOB/cough. Pt had a cervical fusion on 1/16 and was sent over to rule out any cardiac complications. Pt is A&Ox4 and NAD. Ambulatory to triage. Per [facility] staff VS normal.

## 2022-05-23 NOTE — H&P (Signed)
History and Physical   Makayla Villarreal ZOX:[id number] DOB: 27-Jul-1960 DOA: 05/23/2022  PCP: Lauro Regulus, MD  Patient coming from: [facility] via [facility]  I have personally briefly reviewed patient's old medical records in [facility] EMR.  Chief Concern: chest pain  HPI: Ms. Makayla Villarreal back is a 62 year old female with hypothyroid, peripheral vascular disease, anxiety, non-insulin-dependent diabetes mellitus, who presents emergency department for chief concerns of left-sided chest pain.  Patient has history of cervical decompression.  Initial vitals in the ED showed temperature of 97.7, respiration rate of 17, heart rate of 105, blood pressure 151/66, SpO2 of 99% on room air.  Serum sodium is 138, potassium 3.9, chloride 102, bicarb 27, BUN of 11, serum creatinine of 0.74, eGFR greater than 60, nonfasting blood glucose 168, WBC 7.8, hemoglobin 13.5, platelets of 247.  High sensitive troponin was initially 4 and on repeat is 4.  ED treatment: Heparin GGT per pharmacy -------------------- At bedside, she is able to tell me her name, age, current calendar year, current location.  She reports left sided chest pain that started about 2-3 days ago. She reports the pain is sharp and stabbing and worse with breathing in.   She had a cervical fusion and decompression was last Tuesday, 05/16/22. She endorses baseline activities of daily living.   She endorses that a few days ago she noticed her right lower extremity swelling along with tenderness.  She did not think anything of it.    She denies trauma to her person.  Social history: She lives at home with her husband. She is a former tobacco user, quitting in 2004. At her peak, she was smoking about 1 ppd. She endorses etoh, last drink was two weeks ago. She denies recreational drug use. She works in Airline pilot.   ROS: Constitutional: no weight change, no fever ENT/Mouth: no sore throat, no rhinorrhea Eyes: no eye pain, no vision  changes Cardiovascular: + chest pain, no dyspnea,  no edema, no palpitations, lower extremity extremities Respiratory: no cough, no sputum, no wheezing Gastrointestinal: no nausea, no vomiting, no diarrhea, no constipation Genitourinary: no urinary incontinence, no dysuria, no hematuria Musculoskeletal: no arthralgias, no myalgias Skin: no skin lesions, no pruritus, Neuro: + weakness, no loss of consciousness, no syncope Psych: no anxiety, no depression, no decrease appetite Heme/Lymph: no bruising, no bleeding  ED Course: Discussed with emergency medicine provider, patient requiring hospitalization for chief concerns of bilateral PE.  Assessment/Plan  Principal Problem:   Bilateral pulmonary embolism (HCC) Active Problems:   Diabetes mellitus type 2, noninsulin dependent (HCC)   Pain in limb   Swelling of limb   Acquired hypothyroidism   Anxiety   Pulmonary nodule, left   Assessment and Plan:  * Bilateral pulmonary embolism (HCC) - Continue heparin GGT - Ordered bilateral lower extremity ultrasound to assess for DVT - Complete echo ordered - Admit to telemetry cardiac, inpatient  Pulmonary nodule, left - Discussed with patient, recommend continue outpatient follow-up with PCP - Patient endorses understanding and compliance  Anxiety - Resumed home diazepam 2 mg nightly as needed for anxiety  Acquired hypothyroidism - Resumed home levothyroxine 112 mcg daily resumed  Swelling of limb - Bilateral lower extremity ultrasound ordered to assess for DVT  Diabetes mellitus type 2, noninsulin dependent (HCC) - Insulin SSI with at bedtime coverage ordered - Goal inpatient blood glucose levels 140-180  Chart reviewed.   DVT prophylaxis: Heparin GGT Code Status: full code Diet: Heart healthy/carb modified Family Communication: no  Disposition Plan:  Pending clinical course Consults called: None at this time Admission status: Telemetry cardiac, inpatient  Past Medical  History:  Diagnosis Date   Diabetes mellitus without complication ([place])    Peripheral arterial disease ([place])    Past Surgical History:  Procedure Laterality Date   CESAREAN SECTION     twice   HERNIA REPAIR     KNEE CARTILAGE SURGERY     Social History:  reports that she quit smoking about 19 years ago. She has never used smokeless tobacco. She reports current alcohol use. She reports that she does not use drugs.  Allergies  Allergen Reactions   Codeine Nausea Only    Other reaction(s): Hallucination   Etodolac     Other reaction(s): Other (See Comments) Swelling/stomach issues with burning   Family History  Problem Relation Age of Onset   Diabetes Mother    Hyperlipidemia Mother    Varicose Veins Mother    Varicose Veins Maternal Grandfather    Breast cancer Maternal Aunt 81   Breast cancer Paternal Aunt        postmenopausal    Breast cancer Paternal Aunt        postmeno   Family history: Family history reviewed and not pertinent.  Prior to Admission medications   Medication Sig Start Date End Date Taking? Authorizing Provider  docusate sodium (COLACE) 100 MG capsule Take 100 mg by mouth 2 (two) times daily. 05/17/22  Yes [provider]  hydrocortisone (ANUSOL-HC) 2.5 % rectal cream Apply 1 Application topically 3 (three) times daily. 01/09/22  Yes [provider]  methocarbamol (ROBAXIN) 500 MG tablet Take 500 mg by mouth 3 (three) times daily. 05/17/22  Yes [provider]  ondansetron (ZOFRAN-ODT) 8 MG disintegrating tablet Take 8 mg by mouth every 8 (eight) hours as needed for nausea or vomiting. 05/17/22  Yes [provider]  oxyCODONE (OXY IR/ROXICODONE) 5 MG immediate release tablet Take 5 mg by mouth every 6 (six) hours as needed for severe pain. 05/17/22  Yes [provider]  albuterol (PROVENTIL HFA) 108 (90 Base) MCG/ACT inhaler 2 puffs q.i.d. p.r.n. short of breath, wheezing, or cough 01/10/17   [provider]   ALPRAZolam (XANAX) 0.5 MG tablet take 1 tablet by mouth ONE HOUR PRIOR TO PROCEDURE AND 1 TABLET WHEN YOU ARRIVE AT THE OFFICE Patient not taking: Reported on 10/14/2019 10/23/16   [provider]  Ascorbic Acid (VITAMIN C) 1000 MG tablet Take by mouth.    [provider]  diazepam (VALIUM) 2 MG tablet take 1 tablet by mouth at bedtime if needed for anxiety 10/10/16   [provider]  fluticasone (FLONASE) 50 MCG/ACT nasal spray Place into the nose. 07/08/14   [provider]  Garlic 4431 MG CAPS Take by mouth.    [provider]  ibuprofen (ADVIL,MOTRIN) 200 MG tablet Take by mouth.    [provider]  levothyroxine (SYNTHROID) 125 MCG tablet Take 125 mcg by mouth daily before breakfast.    [provider]  Loratadine 10 MG CAPS Take by mouth.    [provider]  magnesium oxide (MAG-OX) 400 MG tablet Take by mouth.    [provider]  metFORMIN (GLUCOPHAGE) 500 MG tablet take 1 tablet by mouth twice a day with meals 03/16/16   [provider]  thyroid (ARMOUR THYROID) 120 MG tablet Take by mouth. Patient not taking: Reported on 10/14/2019 03/14/16   [provider]  Vitamin D, Ergocalciferol, (DRISDOL) 50000 units CAPS  capsule take 1 capsule by mouth every week 03/30/17   [provider]  vitamin E 400 UNIT capsule Take by mouth.    [provider]   Physical Exam: Vitals:   05/23/22 1441 05/23/22 1451  BP: (!) 151/66   Pulse: (!) 105   Resp: 17   Temp: 97.7 F (36.5 C)   TempSrc: Oral   SpO2: 99%   Weight:  81.1 kg  Height:  5\' 7"  (1.702 m)   Constitutional: appears age-appropriate, NAD, calm, comfortable Eyes: PERRL, lids and conjunctivae normal HENMT: Mucous membranes are moist. Posterior pharynx clear of any exudate or lesions. Age-appropriate dentition. Hearing appropriate.  Cervical collar in place. Neck: normal, supple, no masses, no thyromegaly Respiratory: clear  to auscultation bilaterally, no wheezing, no crackles. Normal respiratory effort. No accessory muscle use.  Cardiovascular: Regular rate and rhythm, no murmurs / rubs / gallops. No extremity edema. 2+ pedal pulses. No carotid bruits.  Abdomen: no tenderness, no masses palpated, no hepatosplenomegaly. Bowel sounds positive.  Musculoskeletal: no clubbing / cyanosis. No joint deformity upper and lower extremities. Good ROM, no contractures, no atrophy. Normal muscle tone. Right lower extremity tenderness. Skin: no rashes, lesions, ulcers. No induration Neurologic: Sensation intact. Strength 5/5 in all 4.  Psychiatric: Normal judgment and insight. Alert and oriented x 3. Normal mood.   EKG: independently reviewed, showing sinus tachycardia with rate of 104, QTc 486  Chest x-ray on Admission: I personally reviewed and I agree with radiologist reading as below.  CT Angio Chest PE W and/or Wo Contrast  Result Date: 05/23/2022 CLINICAL DATA:  Pulmonary embolus suspected, left-sided chest pain EXAM: CT ANGIOGRAPHY CHEST WITH CONTRAST TECHNIQUE: Multidetector CT imaging of the chest was performed using the standard protocol during bolus administration of intravenous contrast. Multiplanar CT image reconstructions and MIPs were obtained to evaluate the vascular anatomy. RADIATION DOSE REDUCTION: This exam was performed according to the departmental dose-optimization program which includes automated exposure control, adjustment of the mA and/or kV according to patient size and/or use of iterative reconstruction technique. CONTRAST:  47mL OMNIPAQUE IOHEXOL 350 MG/ML SOLN COMPARISON:  None Available. FINDINGS: Cardiovascular: Subsegmental pulmonary embolus of the right lower lobe and right upper lobe seen on series 2, image 246 and image 200. Normal heart size. No pericardial effusion. Normal caliber thoracic aorta with no significant atherosclerotic disease. Mediastinum/Nodes: Esophagus is unremarkable. No  pathologically enlarged lymph nodes seen in the chest. Lungs/Pleura: Central airways are patent. No consolidation, pleural effusion or pneumothorax. Small solid pulmonary nodule of the left lower lobe measuring 3 mm on series 5, image 67. Upper Abdomen: No acute abnormality. Musculoskeletal: No chest wall abnormality. No acute or significant osseous findings. Review of the MIP images confirms the above findings. IMPRESSION: 1. Small foci of subsegmental pulmonary embolus seen in the right upper lobe and right lower lobe. 2. Small solid pulmonary nodule of the left lower lobe measuring 3 mm. No follow-up needed if patient is low-risk.This recommendation follows the consensus statement: Guidelines for Management of Incidental Pulmonary Nodules Detected on CT Images: From the Fleischner Society 2017; Radiology 2017; 284:228-243. Critical Value/emergent results were called by telephone at the time of interpretation on 05/23/2022 at 5:15 pm to provider Ashok Cordia , who verbally acknowledged these results. Electronically Signed   By: Yetta Glassman M.D.   On: 05/23/2022 17:17   DG Chest 2 View  Result Date: 05/23/2022 CLINICAL DATA:  Chest pain. EXAM: CHEST - 2 VIEW COMPARISON:  None Available. FINDINGS: The heart size  and mediastinal contours are within normal limits. Both lungs are clear. The visualized skeletal structures are unremarkable. IMPRESSION: No active cardiopulmonary disease. Electronically Signed   By: Lupita Raider M.D.   On: 05/23/2022 15:39    Labs on Admission: I have personally reviewed following labs  CBC: Recent Labs  Lab 05/23/22 1449  WBC 7.8  HGB 13.5  HCT 41.9  MCV 91.3  PLT 247   Basic Metabolic Panel: Recent Labs  Lab 05/23/22 1449  NA 138  K 3.9  CL 102  CO2 27  GLUCOSE 168*  BUN 11  CREATININE 0.75  CALCIUM 9.1   GFR: Estimated Creatinine Clearance: 83 mL/min (by C-G formula based on SCr of 0.75 mg/dL).  Urine analysis:    Component Value Date/Time    COLORURINE COLORLESS (A) 02/18/2019 1153   APPEARANCEUR CLEAR (A) 02/18/2019 1153   LABSPEC 1.002 (L) 02/18/2019 1153   PHURINE 7.0 02/18/2019 1153   GLUCOSEU NEGATIVE 02/18/2019 1153   HGBUR NEGATIVE 02/18/2019 1153   BILIRUBINUR NEGATIVE 02/18/2019 1153   KETONESUR NEGATIVE 02/18/2019 1153   PROTEINUR NEGATIVE 02/18/2019 1153   NITRITE NEGATIVE 02/18/2019 1153   LEUKOCYTESUR NEGATIVE 02/18/2019 1153   This document was prepared using Dragon Voice Recognition software and may include unintentional dictation errors.  Dr. Sedalia Muta [facility]  If 7PM-7AM, please contact overnight-coverage provider If 7AM-7PM, please contact day coverage provider [email or web address]  05/23/2022, 6:56 PM

## 2022-05-23 NOTE — ED Provider Notes (Signed)
[facility] Provider Note    Event Date/Time   First MD Initiated Contact with Patient 05/23/22 1615     (approximate)   History   Chest Pain   HPI  Makayla Villarreal is a 62 y.o. female with history of diabetes, hypothyroidism, peripheral vascular disease and recent cervical spine surgery presents to the emergency department with complaints of chest pain and some shortness of breath x 2 days.  Pain is worse with inspiration and deep breath.  States pain is located under the left breast.  She denies any fever or chills.  Surgery was on 05/16/2022.  She denies leg pain.      Physical Exam   Triage Vital Signs: ED Triage Vitals  Enc Vitals Group     BP 05/23/22 1441 (!) 151/66     Pulse Rate 05/23/22 1441 (!) 105     Resp 05/23/22 1441 17     Temp 05/23/22 1441 97.7 F (36.5 C)     Temp Source 05/23/22 1441 Oral     SpO2 05/23/22 1441 99 %     Weight 05/23/22 1451 178 lb 12.8 oz (81.1 kg)     Height 05/23/22 1451 5\' 7"  (1.702 m)     Head Circumference --      Peak Flow --      Pain Score 05/23/22 1451 5     Pain Loc --      Pain Edu? --      Excl. in [place]? --     Most recent vital signs: Vitals:   05/23/22 1441  BP: (!) 151/66  Pulse: (!) 105  Resp: 17  Temp: 97.7 F (36.5 C)  SpO2: 99%     General: Awake, no distress.   CV:  Good peripheral perfusion. tachycardic and  regular rhythm Resp:  Normal effort. Lungs cta Abd:  No distention.   Other:  Patient is currently in a c-collar   ED Results / Procedures / Treatments   Labs (all labs ordered are listed, but only abnormal results are displayed) Labs Reviewed  BASIC METABOLIC PANEL - Abnormal; Notable for the following components:      Result Value   Glucose, Bld 168 (*)    All other components within normal limits  CBC  APTT  PROTIME-INR  CBC  HEPARIN LEVEL (UNFRACTIONATED)  BASIC METABOLIC PANEL  HEMOGLOBIN A1C  TROPONIN I (HIGH SENSITIVITY)  TROPONIN I (HIGH  SENSITIVITY)     EKG   EKG  RADIOLOGY  Chest x-ray, CTA for PE  PROCEDURES:   .Critical Care E&M  Performed by: Versie Starks, PA-C Critical care provider statement:    Critical care time (minutes):  60   Critical care time was exclusive of:  Separately billable procedures and treating other patients   Critical care was necessary to treat or prevent imminent or life-threatening deterioration of the following conditions:  Circulatory failure   Critical care was time spent personally by me on the following activities:  Blood draw for specimens, development of treatment plan with patient or surrogate, examination of patient, interpretation of cardiac output measurements, obtaining history from patient or surrogate, ordering and performing treatments and interventions, ordering and review of laboratory studies, ordering and review of radiographic studies, pulse oximetry, re-evaluation of patient's condition and review of old charts   Care discussed with: admitting provider   After initial E/M assessment, critical care services were subsequently performed that were exclusive of separately billable procedures or treatment.  MEDICATIONS ORDERED IN ED: Medications  heparin bolus via infusion 4,700 Units (4,700 Units Intravenous Bolus from Bag 05/23/22 1810)    Followed by  heparin ADULT infusion 100 units/mL (25000 units/272mL) (1,250 Units/hr Intravenous New Bag/Given 05/23/22 1809)  diazepam (VALIUM) tablet 2 mg (has no administration in time range)  acetaminophen (TYLENOL) tablet 650 mg (has no administration in time range)    Or  acetaminophen (TYLENOL) suppository 650 mg (has no administration in time range)  ondansetron (ZOFRAN) tablet 4 mg (has no administration in time range)    Or  ondansetron (ZOFRAN) injection 4 mg (has no administration in time range)  senna-docusate (Senokot-S) tablet 1 tablet (has no administration in time range)  insulin aspart (novoLOG) injection  0-5 Units (has no administration in time range)  insulin aspart (novoLOG) injection 0-15 Units (has no administration in time range)  levothyroxine (SYNTHROID) tablet 112 mcg (has no administration in time range)  iohexol (OMNIPAQUE) 350 MG/ML injection 75 mL (75 mLs Intravenous Contrast Given 05/23/22 1651)     IMPRESSION / MDM / ASSESSMENT AND PLAN / ED COURSE  I reviewed the triage vital signs and the nursing notes.                              Differential diagnosis includes, but is not limited to, PE, MI, muscle strain, chest wall pain  Patient's presentation is most consistent with acute presentation with potential threat to life or bodily function.   Due to patient's recent surgery along with elevated heart rate and pain on the left side of the chest I do have concerns for PE.  Patient's labs are reassuring, does not indicate an MI at this time.  Chest x-ray independently reviewed and interpreted by me as being negative for any acute abnormality.  Confirmed by radiology  EKG shows sinus tachycardia, no change in QT from previous EKG  Nursing staff instructed to insert IV, CTA for PE was ordered   ----------------------------------------- 5:27 PM on 05/23/2022 ----------------------------------------- Spoke with Dr. Burt Ek from radiology, patient has a PE in the right upper and right lower lobe.  Advised patient I will start her on heparin and call hospitalist.  She is in agreement with treatment plan  Consult hospitalist for admission  Spoke with Dr. Tobie Poet, advised her patient's status for PEs and recent surgery.  Patient is stable at this time   FINAL CLINICAL IMPRESSION(S) / ED DIAGNOSES   Final diagnoses:  PE (pulmonary thromboembolism) ([place])     Rx / DC Orders   ED Discharge Orders     None        Note:  This document was prepared using Dragon voice recognition software and may include unintentional dictation errors.    Versie Starks,  PA-C 05/23/22 1817    Naaman Plummer, MD 05/23/22 [phone]

## 2022-05-23 NOTE — Hospital Course (Addendum)
Ms. Latrese back is a 62 year old female with hypothyroid, peripheral vascular disease, anxiety, non-insulin-dependent diabetes mellitus, who presents emergency department for chief concerns of left-sided chest pain.  Patient has history of cervical decompression.  Initial vitals in the ED showed temperature of 97.7, respiration rate of 17, heart rate of 105, blood pressure 151/66, SpO2 of 99% on room air.  Serum sodium is 138, potassium 3.9, chloride 102, bicarb 27, BUN of 11, serum creatinine of 0.74, eGFR greater than 60, nonfasting blood glucose 168, WBC 7.8, hemoglobin 13.5, platelets of 247.  High sensitive troponin was initially 4 and on repeat is 4.  ED treatment: Heparin GGT per pharmacy

## 2022-05-23 NOTE — ED Notes (Signed)
Pt. And RN discussed heparin treatment, pt. Given further education on pros and cons of blood thinners, reassured by this RN. Pt. Informed pt. Of what to watch for with blood thinners, states will call for nurse if noticing any abnormal bleeding or bruising, and will call for help if she needs to get up out of bed or maneuver.

## 2022-05-23 NOTE — Consult Note (Signed)
ANTICOAGULATION CONSULT NOTE - Initial Consult  Pharmacy Consult for heparin drip Indication: pulmonary embolus  Allergies  Allergen Reactions   Codeine Nausea Only    Other reaction(s): Hallucination   Etodolac     Other reaction(s): Other (See Comments) Swelling/stomach issues with burning    Patient Measurements: Height: 5\' 7"  (170.2 cm) Weight: 81.1 kg (178 lb 12.8 oz) IBW/kg (Calculated) : 61.6 Heparin Dosing Weight: 78.2 kg  Vital Signs: Temp: 97.7 F (36.5 C) (01/23 1441) Temp Source: Oral (01/23 1441) BP: 151/66 (01/23 1441) Pulse Rate: 105 (01/23 1441)  Labs: Recent Labs    05/23/22 1449 05/23/22 1645  HGB 13.5  --   HCT 41.9  --   PLT 247  --   CREATININE 0.75  --   TROPONINIHS 4 4    Estimated Creatinine Clearance: 83 mL/min (by C-G formula based on SCr of 0.75 mg/dL).   Medical History: Past Medical History:  Diagnosis Date   Diabetes mellitus without complication ([place])    Peripheral arterial disease ([place])     Medications:  No home anticoagulants per pharmacist review  Assessment: 62 yo female presented to ED with complaint of shortness of breath and chest pain.  Chest CT was positive for PE.  Pharmacy consulted to initiate heparin drip.  Baseline aPTT and PT/INR are pending  Goal of Therapy:  Heparin level 0.3-0.7 units/ml Monitor platelets by anticoagulation protocol: Yes   Plan:  Give 4700 units bolus x 1 Start heparin infusion at 1250 units/hr Check anti-Xa level in 6 hours and daily while on heparin Continue to monitor H&H and platelets  Lorin Picket, PharmD 05/23/2022,5:32 PM

## 2022-05-23 NOTE — Assessment & Plan Note (Signed)
-  Continue heparin GGT - Ordered bilateral lower extremity ultrasound to assess for DVT - Complete echo ordered - Admit to telemetry cardiac, inpatient

## 2022-05-23 NOTE — Assessment & Plan Note (Signed)
-  Bilateral lower extremity ultrasound ordered to assess for DVT °

## 2022-05-23 NOTE — ED Notes (Signed)
Report to Estill Bamberg, RN over the phone

## 2022-05-23 NOTE — Assessment & Plan Note (Signed)
-  Resumed home diazepam 2 mg nightly as needed for anxiety

## 2022-05-23 NOTE — Assessment & Plan Note (Signed)
-  Insulin SSI with at bedtime coverage ordered ?- Goal inpatient blood glucose levels 140-180 ?

## 2022-05-23 NOTE — ED Notes (Signed)
CBG was 94. This tech will notify nurse. Pt just had a small chocolate ice cream.

## 2022-05-23 NOTE — Assessment & Plan Note (Signed)
-  Discussed with patient, recommend continue outpatient follow-up with PCP - Patient endorses understanding and compliance

## 2022-05-24 DIAGNOSIS — I2699 Other pulmonary embolism without acute cor pulmonale: Secondary | ICD-10-CM | POA: Diagnosis not present

## 2022-05-24 LAB — CBC
HCT: 38.4 % (ref 36.0–46.0)
Hemoglobin: 12.5 g/dL (ref 12.0–15.0)
MCH: 29.7 pg (ref 26.0–34.0)
MCHC: 32.6 g/dL (ref 30.0–36.0)
MCV: 91.2 fL (ref 80.0–100.0)
Platelets: 259 10*3/uL (ref 150–400)
RBC: 4.21 MIL/uL (ref 3.87–5.11)
RDW: 13.3 % (ref 11.5–15.5)
WBC: 6.9 10*3/uL (ref 4.0–10.5)
nRBC: 0 % (ref 0.0–0.2)

## 2022-05-24 LAB — BASIC METABOLIC PANEL
Anion gap: 4 — ABNORMAL LOW (ref 5–15)
BUN: 13 mg/dL (ref 6–20)
CO2: 27 mmol/L (ref 22–32)
Calcium: 8.8 mg/dL — ABNORMAL LOW (ref 8.9–10.3)
Chloride: 106 mmol/L (ref 98–111)
Creatinine, Ser: 0.69 mg/dL (ref 0.44–1.00)
GFR, Estimated: >60 mL/min (ref >60)
Glucose, Bld: 94 mg/dL (ref 70–99)
Potassium: 3.9 mmol/L (ref 3.5–5.1)
Sodium: 137 mmol/L (ref 135–145)

## 2022-05-24 LAB — ECHOCARDIOGRAM COMPLETE
Area-P 1/2: 4.21 cm2
Height: 67 in
S' Lateral: 2.8 cm
Weight: 2860.8 oz

## 2022-05-24 LAB — HEPARIN LEVEL (UNFRACTIONATED)
Heparin Unfractionated: 0.56 IU/mL (ref 0.30–0.70)
Heparin Unfractionated: 0.58 IU/mL (ref 0.30–0.70)
Heparin Unfractionated: 0.77 IU/mL — ABNORMAL HIGH (ref 0.30–0.70)

## 2022-05-24 LAB — HEMOGLOBIN A1C
Hgb A1c MFr Bld: 5.1 % (ref 4.8–5.6)
Mean Plasma Glucose: 99.67 mg/dL

## 2022-05-24 LAB — CBG MONITORING, ED
Glucose-Capillary: 93 mg/dL (ref 70–99)
Glucose-Capillary: 104 mg/dL — ABNORMAL HIGH (ref 70–99)
Glucose-Capillary: 107 mg/dL — ABNORMAL HIGH (ref 70–99)
Glucose-Capillary: 135 mg/dL — ABNORMAL HIGH (ref 70–99)

## 2022-05-24 NOTE — ED Notes (Signed)
Assumed care from Rachael, RN. Pt resting comfortably in bed at this time. Pt denies any current needs or questions. Call light with in reach.   

## 2022-05-24 NOTE — Progress Notes (Signed)
  Progress Note   Patient: Makayla Villarreal XTK:[id number] DOB: 1960/08/06 DOA: 05/23/2022     1 DOS: the patient was seen and examined on 05/24/2022   Brief hospital course:  Assessment and Plan: * Pulmonary embolism ([place]) - Continue heparin drip with pharmacy coagulation monitoring   Pulmonary nodule, left - Outpatient follow-up with PCP  Anxiety - Valium 2 mg PO qhs PRN   Acquired hypothyroidism - Synthroid 112 mcg PO daily   Swelling of limb - No other thrombosis noted on U/S of the lower legs   Diabetes mellitus type 2, noninsulin dependent (HCC) - Novolog SS tid and bedtime   DVT prophylaxis: Heparin drip as above       Subjective: Pt seen and examined at the bedside. She is feeling better vs yesterday, however, she still has some discomfort. For this reason she will remain in the hospital for continued IV heparin drip.   Physical Exam: Vitals:   05/24/22 1130 05/24/22 1200 05/24/22 1230 05/24/22 1330  BP: 104/64 107/63 (!) 113/52 (!) 106/50  Pulse: 85 77 70 80  Resp: 15 15 16 17   Temp:    98.6 F (37 C)  TempSrc:      SpO2: 99% 98% 100% 98%  Weight:      Height:       Physical Exam Constitutional:      Appearance: She is well-developed.  HENT:     Head: Normocephalic.  Cardiovascular:     Rate and Rhythm: Normal rate and regular rhythm.     Heart sounds: Normal heart sounds.  Pulmonary:     Effort: Pulmonary effort is normal.  Abdominal:     Palpations: Abdomen is soft.  Skin:    General: Skin is warm and dry.  Neurological:     General: No focal deficit present.     Mental Status: She is alert.  Psychiatric:        Mood and Affect: Mood normal.     Data Reviewed:    Disposition: Status is: Inpatient  Planned Discharge Destination: Home    Time spent: 35 minutes  Author: Lucienne Minks , MD 05/24/2022 1:58 PM  For on call review [email or web address].

## 2022-05-24 NOTE — ED Notes (Signed)
Attempted to call report to floor x1. 

## 2022-05-24 NOTE — Consult Note (Signed)
[place] for heparin drip Indication: pulmonary embolus  Allergies  Allergen Reactions   Codeine Nausea Only    Other reaction(s): Hallucination   Etodolac     Other reaction(s): Other (See Comments) Swelling/stomach issues with burning    Patient Measurements: Height: 5\' 7"  (170.2 cm) Weight: 81.1 kg (178 lb 12.8 oz) IBW/kg (Calculated) : 61.6 Heparin Dosing Weight: 78.2 kg  Vital Signs: Temp: 98 F (36.7 C) (01/24 0002) Temp Source: Oral (01/23 2041) BP: 116/68 (01/24 0002) Pulse Rate: 83 (01/24 0002)  Labs: Recent Labs    05/23/22 1449 05/23/22 1645 05/24/22 0004  HGB 13.5  --   --   HCT 41.9  --   --   PLT 247  --   --   APTT 28  --   --   LABPROT 13.4  --   --   INR 1.0  --   --   HEPARINUNFRC  --   --  0.77*  CREATININE 0.75  --   --   TROPONINIHS 4 4  --      Estimated Creatinine Clearance: 83 mL/min (by C-G formula based on SCr of 0.75 mg/dL).   Medical History: Past Medical History:  Diagnosis Date   Diabetes mellitus without complication ([place])    Peripheral arterial disease ([place])     Medications:  No home anticoagulants per pharmacist review  Assessment: 62 yo female presented to ED with complaint of shortness of breath and chest pain.  Chest CT was positive for PE.  Pharmacy consulted to initiate heparin drip.  Baseline aPTT and PT/INR are pending  Goal of Therapy:  Heparin level 0.3-0.7 units/ml Monitor platelets by anticoagulation protocol: Yes   1/24 0004 HL 0.77, supratherapeutic  Plan:  Decrease heparin infusion to 1150 units/hr Recheck HL in 6 hrs after rate change CBC daily while on heparin  Renda Rolls, PharmD, MBA 05/24/2022 1:10 AM

## 2022-05-24 NOTE — Consult Note (Signed)
[place] for heparin drip Indication: pulmonary embolus  Allergies  Allergen Reactions   Codeine Nausea Only    Other reaction(s): Hallucination   Etodolac     Other reaction(s): Other (See Comments) Swelling/stomach issues with burning    Patient Measurements: Height: 5\' 7"  (170.2 cm) Weight: 81.1 kg (178 lb 12.8 oz) IBW/kg (Calculated) : 61.6 Heparin Dosing Weight: 78.2 kg  Vital Signs: Temp: 97.9 F (36.6 C) (01/24 0800) Temp Source: Axillary (01/24 0412) BP: 112/66 (01/24 0800) Pulse Rate: 65 (01/24 0800)  Labs: Recent Labs    05/23/22 1449 05/23/22 1645 05/24/22 0004 05/24/22 0413 05/24/22 0811  HGB 13.5  --   --  12.5  --   HCT 41.9  --   --  38.4  --   PLT 247  --   --  259  --   APTT 28  --   --   --   --   LABPROT 13.4  --   --   --   --   INR 1.0  --   --   --   --   HEPARINUNFRC  --   --  0.77*  --  0.58  CREATININE 0.75  --   --  0.69  --   TROPONINIHS 4 4  --   --   --      Estimated Creatinine Clearance: 83 mL/min (by C-G formula based on SCr of 0.69 mg/dL).   Medical History: Past Medical History:  Diagnosis Date   Diabetes mellitus without complication ([place])    Peripheral arterial disease ([place])     Medications:  No home anticoagulants per pharmacist review  Assessment: 62 yo female presented to ED with complaint of shortness of breath and chest pain.  Chest CT was positive for PE.  Pharmacy consulted to initiate heparin drip.  Baseline aPTT 28, PT 13.4, INR 1.0, Hgb 13.5, Hct 41.9, Plts 247  Goal of Therapy:  Heparin level 0.3-0.7 units/ml Monitor platelets by anticoagulation protocol: Yes   1/24 0004 HL 0.77, supratherapeutic 1/24 0811 HL 0.58, therapeutic x 1  Plan:  Continue heparin infusion at 1150 units/hr Recheck HL in 6 hrs after last lab draw. CBC daily while on heparin  Aubery Lapping, PharmD 05/24/2022 9:07 AM

## 2022-05-24 NOTE — Consult Note (Signed)
[place] for heparin drip Indication: pulmonary embolus  Allergies  Allergen Reactions   Codeine Nausea Only    Other reaction(s): Hallucination   Etodolac     Other reaction(s): Other (See Comments) Swelling/stomach issues with burning    Patient Measurements: Height: 5\' 7"  (170.2 cm) Weight: 81.1 kg (178 lb 12.8 oz) IBW/kg (Calculated) : 61.6 Heparin Dosing Weight: 78.2 kg  Vital Signs: Temp: 98.6 F (37 C) (01/24 1330) Temp Source: Axillary (01/24 0412) BP: 106/50 (01/24 1330) Pulse Rate: 80 (01/24 1330)  Labs: Recent Labs    05/23/22 1449 05/23/22 1645 05/24/22 0004 05/24/22 0413 05/24/22 0811 05/24/22 1352  HGB 13.5  --   --  12.5  --   --   HCT 41.9  --   --  38.4  --   --   PLT 247  --   --  259  --   --   APTT 28  --   --   --   --   --   LABPROT 13.4  --   --   --   --   --   INR 1.0  --   --   --   --   --   HEPARINUNFRC  --   --  0.77*  --  0.58 0.56  CREATININE 0.75  --   --  0.69  --   --   TROPONINIHS 4 4  --   --   --   --      Estimated Creatinine Clearance: 83 mL/min (by C-G formula based on SCr of 0.69 mg/dL).   Medical History: Past Medical History:  Diagnosis Date   Diabetes mellitus without complication ([place])    Peripheral arterial disease ([place])     Medications:  No home anticoagulants per pharmacist review  Assessment: 62 yo female presented to ED with complaint of shortness of breath and chest pain.  Chest CT was positive for PE.  Pharmacy consulted to initiate heparin drip.  Baseline aPTT 28, PT 13.4, INR 1.0, Hgb 13.5, Hct 41.9, Plts 247  Goal of Therapy:  Heparin level 0.3-0.7 units/ml Monitor platelets by anticoagulation protocol: Yes   1/24 0004 HL 0.77, supratherapeutic 1/24 0811 HL 0.58, therapeutic x 1 1/24 1352 HL 0.56, therapeutic x 2 Plan:  Continue heparin infusion at 1150 units/hr Recheck HL 1/25 with AM labs CBC daily while on heparin  Beatris Si,  PharmD 05/24/2022 2:44 PM

## 2022-05-24 NOTE — Progress Notes (Signed)
Please update patient that [address] is double shared room

## 2022-05-25 ENCOUNTER — Other Ambulatory Visit (HOSPITAL_COMMUNITY): Payer: Self-pay

## 2022-05-25 DIAGNOSIS — I2699 Other pulmonary embolism without acute cor pulmonale: Secondary | ICD-10-CM | POA: Diagnosis not present

## 2022-05-25 LAB — COMPREHENSIVE METABOLIC PANEL
ALT: 10 U/L (ref 0–44)
AST: 16 U/L (ref 15–41)
Albumin: 3.5 g/dL (ref 3.5–5.0)
Alkaline Phosphatase: 50 U/L (ref 38–126)
Anion gap: 7 (ref 5–15)
BUN: 20 mg/dL (ref 6–20)
CO2: 26 mmol/L (ref 22–32)
Calcium: 8.9 mg/dL (ref 8.9–10.3)
Chloride: 105 mmol/L (ref 98–111)
Creatinine, Ser: 0.75 mg/dL (ref 0.44–1.00)
GFR, Estimated: >60 mL/min (ref >60)
Glucose, Bld: 95 mg/dL (ref 70–99)
Potassium: 4.1 mmol/L (ref 3.5–5.1)
Sodium: 138 mmol/L (ref 135–145)
Total Bilirubin: 0.5 mg/dL (ref 0.3–1.2)
Total Protein: 6.6 g/dL (ref 6.5–8.1)

## 2022-05-25 LAB — CBC
HCT: 38.7 % (ref 36.0–46.0)
Hemoglobin: 12.7 g/dL (ref 12.0–15.0)
MCH: 29.5 pg (ref 26.0–34.0)
MCHC: 32.8 g/dL (ref 30.0–36.0)
MCV: 89.8 fL (ref 80.0–100.0)
Platelets: 266 10*3/uL (ref 150–400)
RBC: 4.31 MIL/uL (ref 3.87–5.11)
RDW: 13.2 % (ref 11.5–15.5)
WBC: 6.9 10*3/uL (ref 4.0–10.5)
nRBC: 0 % (ref 0.0–0.2)

## 2022-05-25 LAB — GLUCOSE, CAPILLARY
Glucose-Capillary: 97 mg/dL (ref 70–99)
Glucose-Capillary: 134 mg/dL — ABNORMAL HIGH (ref 70–99)

## 2022-05-25 LAB — HEPARIN LEVEL (UNFRACTIONATED): Heparin Unfractionated: 0.67 IU/mL (ref 0.30–0.70)

## 2022-05-25 LAB — MAGNESIUM: Magnesium: 2.3 mg/dL (ref 1.7–2.4)

## 2022-05-25 MED ORDER — APIXABAN 5 MG PO TABS: 10.0000 mg | ORAL_TABLET | Freq: Two times a day (BID) | ORAL | 0 refills | Status: AC

## 2022-05-25 MED ORDER — APIXABAN 5 MG PO TABS: 5.0000 mg | ORAL_TABLET | Freq: Two times a day (BID) | ORAL | 0 refills | Status: AC

## 2022-05-25 MED ORDER — APIXABAN 5 MG PO TABS
10.0000 mg | ORAL_TABLET | Freq: Two times a day (BID) | ORAL | Status: DC
  Administered 2022-05-25: 10 mg via ORAL
  Filled 2022-05-25: qty 2

## 2022-05-25 MED ORDER — APIXABAN 5 MG PO TABS: 5.0000 mg | ORAL_TABLET | Freq: Two times a day (BID) | ORAL | Status: DC

## 2022-05-25 NOTE — TOC Benefit Eligibility Note (Signed)
Patient Teacher, English as a foreign language completed.    The patient is currently admitted and upon discharge could be taking Eliquis 5 mg.  The current 30 day co-pay is $40.00.   The patient is insured through [employer], [place] Patient [place] Patient Advocate Team Direct Number: [phone]  Fax: [phone]

## 2022-05-25 NOTE — Consult Note (Signed)
[place] for heparin drip Indication: pulmonary embolus  Allergies  Allergen Reactions   Codeine Nausea Only    Other reaction(s): Hallucination   Etodolac     Other reaction(s): Other (See Comments) Swelling/stomach issues with burning    Patient Measurements: Height: 5\' 7"  (170.2 cm) Weight: 79 kg (174 lb 2.6 oz) IBW/kg (Calculated) : 61.6 Heparin Dosing Weight: 78.2 kg  Vital Signs: Temp: 97.3 F (36.3 C) (01/25 0456) Temp Source: Oral (01/25 0456) BP: 130/65 (01/25 0456) Pulse Rate: 68 (01/25 0456)  Labs: Recent Labs    05/23/22 1449 05/23/22 1645 05/24/22 0004 05/24/22 0413 05/24/22 0811 05/24/22 1352 05/25/22 0529  HGB 13.5  --   --  12.5  --   --  12.7  HCT 41.9  --   --  38.4  --   --  38.7  PLT 247  --   --  259  --   --  266  APTT 28  --   --   --   --   --   --   LABPROT 13.4  --   --   --   --   --   --   INR 1.0  --   --   --   --   --   --   HEPARINUNFRC  --   --    < >  --  0.58 0.56 0.67  CREATININE 0.75  --   --  0.69  --   --  0.75  TROPONINIHS 4 4  --   --   --   --   --    < > = values in this interval not displayed.     Estimated Creatinine Clearance: 82 mL/min (by C-G formula based on SCr of 0.75 mg/dL).   Medical History: Past Medical History:  Diagnosis Date   Diabetes mellitus without complication ([place])    Peripheral arterial disease ([place])     Medications:  No home anticoagulants per pharmacist review  Assessment: 62 yo female presented to ED with complaint of shortness of breath and chest pain.  Chest CT was positive for PE.  Pharmacy consulted to initiate heparin drip.  Baseline aPTT 28, PT 13.4, INR 1.0, Hgb 13.5, Hct 41.9, Plts 247  Goal of Therapy:  Heparin level 0.3-0.7 units/ml Monitor platelets by anticoagulation protocol: Yes   1/24 0004 HL 0.77, supratherapeutic 1/24 0811 HL 0.58, therapeutic x 1 1/24 1352 HL 0.56, therapeutic x 2 1/25 0529 HL 0.67, therapeutic x  3  Plan:  Continue heparin infusion at 1150 units/hr Recheck HL 1/26 with AM labs CBC daily while on heparin  Renda Rolls, PharmD, [facility] 05/25/2022 6:39 AM

## 2022-05-25 NOTE — Discharge Summary (Signed)
Physician Discharge Summary   Patient: Makayla Villarreal MRN: [id number] DOB: 1961/04/13  Admit date:     05/23/2022  Discharge date: 05/25/22  Discharge Physician: Baron Hamper    PCP: Lauro Regulus, MD   Recommendations at discharge:    Please take the eliquis at home as prescribed (10 mg twice daily for 7 days followed by 5 mg twice daily.)  Discharge Diagnoses: Principal Problem:   Bilateral pulmonary embolism (HCC) Active Problems:   Diabetes mellitus type 2, noninsulin dependent (HCC)   Pain in limb   Swelling of limb   Acquired hypothyroidism   Anxiety   Pulmonary nodule, left  Resolved Problems:   * No resolved hospital problems. *  Hospital Course: 63 yo F treated for PE. She was placed on a heparin drip with anti-coagulation monitoring. Her chest pain did improve with the IV heparin drip. Doppler U/S of the lower legs did not reveal any other thrombosis in the lower legs. On the morning of 05/25/2022 the pt was transitioned for PO eliquis. She will need to continue with eliquis at home in the following manner:   Eliquis 10 mg twice daily for 7 days followed by 5 mg twice daily. Please avoid head trauma and other NSAIDs while taking eliquis. ECHO showed LVEF 60-65%. RV systolic function was normal.  In regards to the L pulmonary nodule the pt should have follow up with her PCP for further investigation and workup of this pulmonary nodule.   Assessment and Plan: * Bilateral pulmonary embolism (HCC) - Continue heparin GGT - Ordered bilateral lower extremity ultrasound to assess for DVT - Complete echo ordered - Admit to telemetry cardiac, inpatient  Pulmonary nodule, left - Discussed with patient, recommend continue outpatient follow-up with PCP - Patient endorses understanding and compliance  Anxiety - Resumed home diazepam 2 mg nightly as needed for anxiety  Acquired hypothyroidism - Resumed home levothyroxine 112 mcg daily resumed  Swelling of limb -  Bilateral lower extremity ultrasound ordered to assess for DVT  Diabetes mellitus type 2, noninsulin dependent (HCC) - Insulin SSI with at bedtime coverage ordered - Goal inpatient blood glucose levels 140-180        Procedures performed: ECHO Disposition: Home Diet recommendation:  Carb modified diet DISCHARGE MEDICATION: Allergies as of 05/25/2022       Reactions   Codeine Nausea Only   Other reaction(s): Hallucination   Etodolac    Other reaction(s): Other (See Comments) Swelling/stomach issues with burning        Medication List     STOP taking these medications    Armour Thyroid 120 MG tablet Generic drug: thyroid   ibuprofen 200 MG tablet Commonly known as: ADVIL   methocarbamol 500 MG tablet Commonly known as: ROBAXIN       TAKE these medications    ALPRAZolam 0.5 MG tablet Commonly known as: XANAX take 1 tablet by mouth ONE HOUR PRIOR TO PROCEDURE AND 1 TABLET WHEN YOU ARRIVE AT THE OFFICE   apixaban 5 MG Tabs tablet Commonly known as: ELIQUIS Take 2 tablets (10 mg total) by mouth 2 (two) times daily for 7 days.   apixaban 5 MG Tabs tablet Commonly known as: ELIQUIS Take 1 tablet (5 mg total) by mouth 2 (two) times daily. Start taking on: June 01, 2022   diazepam 2 MG tablet Commonly known as: VALIUM take 1 tablet by mouth at bedtime if needed for anxiety   docusate sodium 100 MG capsule Commonly known as: COLACE Take  100 mg by mouth 2 (two) times daily.   fluticasone 50 MCG/ACT nasal spray Commonly known as: FLONASE Place into the nose.   Garlic 1000 MG Caps Take by mouth.   hydrocortisone 2.5 % rectal cream Commonly known as: ANUSOL-HC Apply 1 Application topically 3 (three) times daily.   levothyroxine 125 MCG tablet Commonly known as: SYNTHROID Take 112 mcg by mouth daily before breakfast.   Loratadine 10 MG Caps Take by mouth.   magnesium oxide 400 MG tablet Commonly known as: MAG-OX Take 250 mg by mouth daily.    metFORMIN 500 MG tablet Commonly known as: GLUCOPHAGE take 1 tablet by mouth twice a day with meals   ondansetron 8 MG disintegrating tablet Commonly known as: ZOFRAN-ODT Take 8 mg by mouth every 8 (eight) hours as needed for nausea or vomiting.   oxyCODONE 5 MG immediate release tablet Commonly known as: Oxy IR/ROXICODONE Take 5 mg by mouth every 6 (six) hours as needed for severe pain.   Proventil HFA 108 (90 Base) MCG/ACT inhaler Generic drug: albuterol 2 puffs q.i.d. p.r.n. short of breath, wheezing, or cough   vitamin C 1000 MG tablet Take by mouth.   Vitamin D (Ergocalciferol) 1.25 MG (50000 UNIT) Caps capsule Commonly known as: DRISDOL take 1 capsule by mouth every week   vitamin E 180 MG (400 UNITS) capsule Take by mouth.        Discharge Exam: Filed Weights   05/23/22 1451 05/25/22 0010  Weight: 81.1 kg 79 kg    Condition at discharge: good  The results of significant diagnostics from this hospitalization (including imaging, microbiology, ancillary and laboratory) are listed below for reference.   Imaging Studies: ECHOCARDIOGRAM COMPLETE  Result Date: 05/24/2022    ECHOCARDIOGRAM REPORT   Patient Name:   Makayla Villarreal Date of Exam: 05/23/2022 Medical Rec #:  [id number]        Height:       67.0 in Accession #:    [id number]       Weight:       178.8 lb Date of Birth:  09/05/1960         BSA:          1.928 m Patient Age:    61 years         BP:           151/66 mmHg Patient Gender: F                HR:           80 bpm. Exam Location:  [facility] Procedure: 2D Echo, Cardiac Doppler and Color Doppler Indications:     I26.09 Pulmonary embolism.  History:         Patient has no prior history of Echocardiogram examinations.                  Risk Factors:Diabetes.  Sonographer:     Daphine Deutscher RDCS Referring Phys:  [id number] AMY N COX Diagnosing Phys: Marcina Millard MD IMPRESSIONS  1. Left ventricular ejection fraction, by estimation, is 60 to 65%. The left  ventricle has normal function. The left ventricle has no regional wall motion abnormalities. Left ventricular diastolic parameters are consistent with Grade I diastolic dysfunction (impaired relaxation).  2. Right ventricular systolic function is normal. The right ventricular size is normal.  3. The mitral valve is normal in structure. Trivial mitral valve regurgitation. No evidence of mitral stenosis.  4. The aortic valve is normal in structure.  Aortic valve regurgitation is not visualized. No aortic stenosis is present.  5. The inferior vena cava is normal in size with greater than 50% respiratory variability, suggesting right atrial pressure of 3 mmHg. FINDINGS  Left Ventricle: Left ventricular ejection fraction, by estimation, is 60 to 65%. The left ventricle has normal function. The left ventricle has no regional wall motion abnormalities. The left ventricular internal cavity size was normal in size. There is  no left ventricular hypertrophy. Left ventricular diastolic parameters are consistent with Grade I diastolic dysfunction (impaired relaxation). Right Ventricle: The right ventricular size is normal. No increase in right ventricular wall thickness. Right ventricular systolic function is normal. Left Atrium: Left atrial size was normal in size. Right Atrium: Right atrial size was normal in size. Pericardium: There is no evidence of pericardial effusion. Mitral Valve: The mitral valve is normal in structure. Trivial mitral valve regurgitation. No evidence of mitral valve stenosis. Tricuspid Valve: The tricuspid valve is normal in structure. Tricuspid valve regurgitation is trivial. No evidence of tricuspid stenosis. Aortic Valve: The aortic valve is normal in structure. Aortic valve regurgitation is not visualized. No aortic stenosis is present. Pulmonic Valve: The pulmonic valve was normal in structure. Pulmonic valve regurgitation is not visualized. No evidence of pulmonic stenosis. Aorta: The aortic root  is normal in size and structure. Venous: The inferior vena cava is normal in size with greater than 50% respiratory variability, suggesting right atrial pressure of 3 mmHg. IAS/Shunts: No atrial level shunt detected by color flow Doppler.  LEFT VENTRICLE PLAX 2D LVIDd:         4.20 cm   Diastology LVIDs:         2.80 cm   LV e' medial:    9.25 cm/s LV PW:         1.00 cm   LV E/e' medial:  7.1 LV IVS:        0.80 cm   LV e' lateral:   11.70 cm/s LVOT diam:     2.40 cm   LV E/e' lateral: 5.6 LV SV:         81 LV SV Index:   42 LVOT Area:     4.52 cm  RIGHT VENTRICLE RV Basal diam:  3.60 cm RV S prime:     16.55 cm/s TAPSE (M-mode): 2.0 cm LEFT ATRIUM             Index        RIGHT ATRIUM          Index LA diam:        3.30 cm 1.71 cm/m   RA Area:     9.30 cm LA Vol (A2C):   28.9 ml 14.99 ml/m  RA Volume:   19.90 ml 10.32 ml/m LA Vol (A4C):   25.0 ml 12.97 ml/m LA Biplane Vol: 27.1 ml 14.05 ml/m  AORTIC VALVE LVOT Vmax:   92.35 cm/s LVOT Vmean:  61.550 cm/s LVOT VTI:    0.178 m  AORTA Ao Root diam: 3.30 cm Ao Asc diam:  2.90 cm MITRAL VALVE MV Area (PHT): 4.21 cm    SHUNTS MV Decel Time: 180 msec    Systemic VTI:  0.18 m MV E velocity: 65.80 cm/s  Systemic Diam: 2.40 cm MV A velocity: 84.60 cm/s MV E/A ratio:  0.78 Marcina Millard MD Electronically signed by Marcina Millard MD Signature Date/Time: 05/24/2022/1:41:33 PM    Final    US Venous Img Lower Bilateral (DVT)  Result Date: 05/23/2022  CLINICAL DATA:  Bilateral pulmonary embolism, pain, edema EXAM: BILATERAL LOWER EXTREMITY VENOUS DOPPLER ULTRASOUND TECHNIQUE: Gray-scale sonography with compression, as well as color and duplex ultrasound, were performed to evaluate the deep venous system(s) from the level of the common femoral vein through the popliteal and proximal calf veins. COMPARISON:  None Available. FINDINGS: VENOUS Normal compressibility of the common femoral, superficial femoral, and popliteal veins, as well as the visualized calf  veins. Visualized portions of profunda femoral vein and great saphenous vein unremarkable. No filling defects to suggest DVT on grayscale or color Doppler imaging. Doppler waveforms show normal direction of venous flow, normal respiratory plasticity and response to augmentation. OTHER None. Limitations: none IMPRESSION: Negative. Electronically Signed   By: Lucrezia Europe M.D.   On: 05/23/2022 19:34   CT Angio Chest PE W and/or Wo Contrast  Result Date: 05/23/2022 CLINICAL DATA:  Pulmonary embolus suspected, left-sided chest pain EXAM: CT ANGIOGRAPHY CHEST WITH CONTRAST TECHNIQUE: Multidetector CT imaging of the chest was performed using the standard protocol during bolus administration of intravenous contrast. Multiplanar CT image reconstructions and MIPs were obtained to evaluate the vascular anatomy. RADIATION DOSE REDUCTION: This exam was performed according to the departmental dose-optimization program which includes automated exposure control, adjustment of the mA and/or kV according to patient size and/or use of iterative reconstruction technique. CONTRAST:  43mL OMNIPAQUE IOHEXOL 350 MG/ML SOLN COMPARISON:  None Available. FINDINGS: Cardiovascular: Subsegmental pulmonary embolus of the right lower lobe and right upper lobe seen on series 2, image 246 and image 200. Normal heart size. No pericardial effusion. Normal caliber thoracic aorta with no significant atherosclerotic disease. Mediastinum/Nodes: Esophagus is unremarkable. No pathologically enlarged lymph nodes seen in the chest. Lungs/Pleura: Central airways are patent. No consolidation, pleural effusion or pneumothorax. Small solid pulmonary nodule of the left lower lobe measuring 3 mm on series 5, image 67. Upper Abdomen: No acute abnormality. Musculoskeletal: No chest wall abnormality. No acute or significant osseous findings. Review of the MIP images confirms the above findings. IMPRESSION: 1. Small foci of subsegmental pulmonary embolus seen in the  right upper lobe and right lower lobe. 2. Small solid pulmonary nodule of the left lower lobe measuring 3 mm. No follow-up needed if patient is low-risk.This recommendation follows the consensus statement: Guidelines for Management of Incidental Pulmonary Nodules Detected on CT Images: From the Fleischner Society 2017; Radiology 2017; 284:228-243. Critical Value/emergent results were called by telephone at the time of interpretation on 05/23/2022 at 5:15 pm to provider Ashok Cordia , who verbally acknowledged these results. Electronically Signed   By: Yetta Glassman M.D.   On: 05/23/2022 17:17   DG Chest 2 View  Result Date: 05/23/2022 CLINICAL DATA:  Chest pain. EXAM: CHEST - 2 VIEW COMPARISON:  None Available. FINDINGS: The heart size and mediastinal contours are within normal limits. Both lungs are clear. The visualized skeletal structures are unremarkable. IMPRESSION: No active cardiopulmonary disease. Electronically Signed   By: Marijo Conception M.D.   On: 05/23/2022 15:39    Microbiology: Results for orders placed or performed in visit on 05/15/19  Novel Coronavirus, NAA ([facility])     Status: None   Collection Time: 05/15/19  9:49 AM   Specimen: Nasopharyngeal(NP) swabs in vial transport medium   NASOPHARYNGE  TESTING  Result Value Ref Range Status   SARS-CoV-2, NAA Not Detected Not Detected Final    Comment: This nucleic acid amplification test was developed and its performance characteristics determined by [employer]. Nucleic acid amplification tests include PCR  and TMA. This test has not been FDA cleared or approved. This test has been authorized by FDA under an Emergency Use Authorization (EUA). This test is only authorized for the duration of time the declaration that circumstances exist justifying the authorization of the emergency use of in vitro diagnostic tests for detection of SARS-CoV-2 virus and/or diagnosis of COVID-19 infection under section 564(b)(1) of the Act,  21 U.S.C. 132GMW-1(U) (1), unless the authorization is terminated or revoked sooner. When diagnostic testing is negative, the possibility of a false negative result should be considered in the context of a patient's recent exposures and the presence of clinical signs and symptoms consistent with COVID-19. An individual without symptoms of COVID-19 and who is not shedding SARS-CoV-2 virus would  expect to have a negative (not detected) result in this assay.     Labs: CBC: Recent Labs  Lab 05/23/22 1449 05/24/22 0413 05/25/22 0529  WBC 7.8 6.9 6.9  HGB 13.5 12.5 12.7  HCT 41.9 38.4 38.7  MCV 91.3 91.2 89.8  PLT 247 259 272   Basic Metabolic Panel: Recent Labs  Lab 05/23/22 1449 05/24/22 0413 05/25/22 0529  NA 138 137 138  K 3.9 3.9 4.1  CL 102 106 105  CO2 27 27 26   GLUCOSE 168* 94 95  BUN 11 13 20   CREATININE 0.75 0.69 0.75  CALCIUM 9.1 8.8* 8.9  MG  --   --  2.3   Liver Function Tests: Recent Labs  Lab 05/25/22 0529  AST 16  ALT 10  ALKPHOS 50  BILITOT 0.5  PROT 6.6  ALBUMIN 3.5   CBG: Recent Labs  Lab 05/24/22 1112 05/24/22 1635 05/24/22 2216 05/25/22 0438 05/25/22 0904  GLUCAP 104* 107* 135* 97 134*    Discharge time spent: greater than 30 minutes.  Signed: Lucienne Minks , MD [facility] 05/25/2022

## 2022-05-25 NOTE — TOC Initial Note (Signed)
Transition of Care [facility]) - Initial/Assessment Note    Patient Details  Name: Makayla Villarreal MRN: [id number] Date of Birth: May 10, 1960  Transition of Care [facility]) CM/SW Contact:    Laurena Slimmer, RN Phone Number: 05/25/2022, 3:09 PM  Clinical Narrative:                  Transition of Care [facility]) Screening Note   Patient Details  Name: Makayla Villarreal Date of Birth: 1960-07-07   Transition of Care [facility]) CM/SW Contact:    Laurena Slimmer, RN Phone Number: 05/25/2022, 3:09 PM    Transition of Care Department [facility]) has reviewed patient and no TOC needs have been identified at this time. We will continue to monitor patient advancement through interdisciplinary progression rounds. If new patient transition needs arise, please place a TOC consult.          Patient Goals and CMS Choice            Expected Discharge Plan and Services         Expected Discharge Date: 05/25/22                                    Prior Living Arrangements/Services                       Activities of Daily Living      Permission Sought/Granted                  Emotional Assessment              Admission diagnosis:  PE (pulmonary thromboembolism) ([place]) [I26.99] Bilateral pulmonary embolism ([place]) [I26.99] Patient Active Problem List   Diagnosis Date Noted   Bilateral pulmonary embolism ([place]) 05/23/2022   Pulmonary nodule, left 05/23/2022   Palpitations 08/04/2019   Acquired hypothyroidism 02/18/2019   Healthcare maintenance 12/25/2018   Post menopausal syndrome 04/17/2018   Primary osteoarthritis involving multiple joints 04/17/2018   Venous insufficiency of both lower extremities 07/21/2016   Chronic left hip pain 07/11/2016   Lumbar radiculopathy, chronic 07/11/2016   Diabetes mellitus type 2, noninsulin dependent ([place]) 04/18/2016   Pain in limb 04/18/2016   Swelling of limb 04/18/2016   Varicose veins of leg with pain, bilateral 04/18/2016    Chronic pain of left ankle 04/01/2016   Prediabetes 03/30/2015   Allergic rhinitis 01/07/2014   Anxiety 09/04/2013   Constipation 09/04/2013   Osteopenia 09/04/2013   PCP:  Kirk Ruths, MD Pharmacy:   [facility] [place], [place] - [address] [place] [address] Phone: [phone] Fax: [phone]  [place], [place] - [address] [place] [address] Phone: [phone] Fax: [phone]     Social Determinants of Health (SDOH) Social History: SDOH Screenings   Tobacco Use: Medium Risk (05/23/2022)   SDOH Interventions:     Readmission Risk Interventions     No data to display

## 2022-07-29 ENCOUNTER — Ambulatory Visit (INDEPENDENT_AMBULATORY_CARE_PROVIDER_SITE_OTHER): Payer: No Typology Code available for payment source

## 2022-07-29 ENCOUNTER — Ambulatory Visit
Admission: EM | Admit: 2022-07-29 | Discharge: 2022-07-29 | Disposition: A | Discharge status: Home / Self Care | Payer: No Typology Code available for payment source | Attending: Urgent Care | Admitting: Urgent Care

## 2022-07-29 DIAGNOSIS — M25522 Pain in left elbow: Secondary | ICD-10-CM | POA: Diagnosis not present

## 2022-07-29 DIAGNOSIS — W19XXXA Unspecified fall, initial encounter: Secondary | ICD-10-CM | POA: Diagnosis not present

## 2022-07-29 DIAGNOSIS — R0789 Other chest pain: Secondary | ICD-10-CM | POA: Diagnosis not present

## 2022-07-29 NOTE — ED Provider Notes (Signed)
Roderic Palau    CSN: [id number] Arrival date & time: 07/29/22  1003      History   Chief Complaint Chief Complaint  Patient presents with   Fall   Rib Injury   Arm Injury    HPI Lechelle Alya Friley is a 62 y.o. female.    Fall  Arm Injury   Presents to urgent care following a fall yesterday.  Patient states she landed on her left side and endorses left-sided rib pain as well as left arm pain.  Patient is anti-coagulated with Eliquis.  Recent neck surgery.  Past Medical History:  Diagnosis Date   Diabetes mellitus without complication ([place])    Peripheral arterial disease ([place])     Patient Active Problem List   Diagnosis Date Noted   Bilateral pulmonary embolism ([place]) 05/23/2022   Pulmonary nodule, left 05/23/2022   Palpitations 08/04/2019   Acquired hypothyroidism 02/18/2019   Healthcare maintenance 12/25/2018   Post menopausal syndrome 04/17/2018   Primary osteoarthritis involving multiple joints 04/17/2018   Venous insufficiency of both lower extremities 07/21/2016   Chronic left hip pain 07/11/2016   Lumbar radiculopathy, chronic 07/11/2016   Diabetes mellitus type 2, noninsulin dependent ([place]) 04/18/2016   Pain in limb 04/18/2016   Swelling of limb 04/18/2016   Varicose veins of leg with pain, bilateral 04/18/2016   Chronic pain of left ankle 04/01/2016   Prediabetes 03/30/2015   Allergic rhinitis 01/07/2014   Anxiety 09/04/2013   Constipation 09/04/2013   Osteopenia 09/04/2013    Past Surgical History:  Procedure Laterality Date   CESAREAN SECTION     twice   HERNIA REPAIR     KNEE CARTILAGE SURGERY      OB History   No obstetric history on file.      Home Medications    Prior to Admission medications   Medication Sig Start Date End Date Taking? Authorizing Provider  albuterol (PROVENTIL HFA) 108 (90 Base) MCG/ACT inhaler 2 puffs q.i.d. p.r.n. short of breath, wheezing, or cough 01/10/17   [provider]  ALPRAZolam  (XANAX) 0.5 MG tablet take 1 tablet by mouth ONE HOUR PRIOR TO PROCEDURE AND 1 TABLET WHEN YOU ARRIVE AT THE OFFICE Patient not taking: No sig reported 10/23/16   [provider]  apixaban (ELIQUIS) 5 MG TABS tablet Take 2 tablets (10 mg total) by mouth 2 (two) times daily for 7 days. 05/25/22 06/01/22  Lucienne Minks, MD  apixaban (ELIQUIS) 5 MG TABS tablet Take 1 tablet (5 mg total) by mouth 2 (two) times daily. 06/01/22 07/01/22  Lucienne Minks, MD  Ascorbic Acid (VITAMIN C) 1000 MG tablet Take by mouth.    [provider]  diazepam (VALIUM) 2 MG tablet take 1 tablet by mouth at bedtime if needed for anxiety 10/10/16   [provider]  docusate sodium (COLACE) 100 MG capsule Take 100 mg by mouth 2 (two) times daily. Patient not taking: Reported on 05/23/2022 05/17/22   [provider]  fluticasone (FLONASE) 50 MCG/ACT nasal spray Place into the nose. 07/08/14   [provider]  Garlic 123XX123 MG CAPS Take by mouth.    [provider]  hydrocortisone (ANUSOL-HC) 2.5 % rectal cream Apply 1 Application topically 3 (three) times daily. 01/09/22   [provider]  levothyroxine (SYNTHROID) 125 MCG tablet Take 112 mcg by mouth daily before breakfast.    [provider]  Loratadine 10 MG CAPS Take by mouth.    [provider]  magnesium  oxide (MAG-OX) 400 MG tablet Take 250 mg by mouth daily.    [provider]  metFORMIN (GLUCOPHAGE) 500 MG tablet take 1 tablet by mouth twice a day with meals 03/16/16   [provider]  ondansetron (ZOFRAN-ODT) 8 MG disintegrating tablet Take 8 mg by mouth every 8 (eight) hours as needed for nausea or vomiting. 05/17/22   [provider]  oxyCODONE (OXY IR/ROXICODONE) 5 MG immediate release tablet Take 5 mg by mouth every 6 (six) hours as needed for severe pain. 05/17/22   [provider]  Vitamin D, Ergocalciferol, (DRISDOL) 50000 units CAPS capsule take 1 capsule by mouth  every week 03/30/17   [provider]  vitamin E 400 UNIT capsule Take by mouth. Patient not taking: Reported on 05/23/2022    [provider]    Family History Family History  Problem Relation Age of Onset   Diabetes Mother    Hyperlipidemia Mother    Varicose Veins Mother    Varicose Veins Maternal Grandfather    Breast cancer Maternal Aunt 42   Breast cancer Paternal Aunt        postmenopausal    Breast cancer Paternal Aunt        postmeno    Social History Social History   Tobacco Use   Smoking status: Former    Types: Cigarettes    Quit date: 2005    Years since quitting: 19.2   Smokeless tobacco: Never  Substance Use Topics   Alcohol use: Yes   Drug use: No     Allergies   Codeine, Etodolac, and Hydrocodone   Review of Systems Review of Systems   Physical Exam Triage Vital Signs ED Triage Vitals  Enc Vitals Group     BP --      Pulse Rate 07/29/22 1106 85     Resp 07/29/22 1106 16     Temp 07/29/22 1106 97.8 F (36.6 C)     Temp Source 07/29/22 1106 Temporal     SpO2 07/29/22 1106 98 %     Weight --      Height --      Head Circumference --      Peak Flow --      Pain Score 07/29/22 1105 7     Pain Loc --      Pain Edu? --      Excl. in [place]? --    No data found.  Updated Vital Signs Pulse 85   Temp 97.8 F (36.6 C) (Temporal)   Resp 16   SpO2 98%   Visual Acuity Right Eye Distance:   Left Eye Distance:   Bilateral Distance:    Right Eye Near:   Left Eye Near:    Bilateral Near:     Physical Exam Vitals reviewed.  Constitutional:      Appearance: Normal appearance.  Chest:     Chest wall: Tenderness present. No deformity or crepitus.    Musculoskeletal:     Left elbow: No deformity. Normal range of motion. Tenderness present in olecranon process.       Arms:  Skin:    General: Skin is warm and dry.  Neurological:     General: No focal deficit present.     Mental Status: She is alert and oriented to  person, place, and time.  Psychiatric:        Mood and Affect: Mood normal.        Behavior: Behavior normal.  UC Treatments / Results  Labs (all labs ordered are listed, but only abnormal results are displayed) Labs Reviewed - No data to display  EKG   Radiology No results found.  Procedures Procedures (including critical care time)  Medications Ordered in UC Medications - No data to display  Initial Impression / Assessment and Plan / UC Course  I have reviewed the triage vital signs and the nursing notes.  Pertinent labs & imaging results that were available during my care of the patient were reviewed by me and considered in my medical decision making (see chart for details).   Chest wall x-rays (left) and left elbow x-rays ordered.  Elbow: No evidence of acute fracture or dislocation. Possible small loose body anteriorly.  L Ribs: Negative  Recommend use of Tylenol, warm compresses, topical lidocaine patch as appropriate for relief of pain.  Patient acknowledges understanding and agreement with this treatment plan.  Final Clinical Impressions(s) / UC Diagnoses   Final diagnoses:  Left-sided chest wall pain  Fall, initial encounter   Discharge Instructions   None    ED Prescriptions   None    PDMP not reviewed this encounter.   Rose Phi, [place] 07/29/22 1221

## 2022-07-29 NOTE — Discharge Instructions (Addendum)
Recommend use of Tylenol, warm compress, cold, lidocaine patch for relief of pain.  Follow-up with your primary care provider if symptoms worsen.

## 2022-07-29 NOTE — ED Triage Notes (Signed)
Patient presents to UC for a fall yesterday, landing on her left side. Now has rib pain and left arm. Taking tylenol.

## 2022-11-07 ENCOUNTER — Other Ambulatory Visit
Admission: RE | Admit: 2022-11-07 | Discharge: 2022-11-07 | Disposition: A | Discharge status: Home / Self Care | Payer: No Typology Code available for payment source | Source: Ambulatory Visit | Attending: Internal Medicine | Admitting: Internal Medicine

## 2022-11-07 DIAGNOSIS — R6883 Chills (without fever): Secondary | ICD-10-CM | POA: Insufficient documentation

## 2022-11-07 DIAGNOSIS — R0781 Pleurodynia: Secondary | ICD-10-CM | POA: Diagnosis present

## 2022-11-07 LAB — D-DIMER, QUANTITATIVE: D-Dimer, Quant: <0.27 ug/mL-FEU (ref 0.00–0.50)

## 2022-11-07 LAB — TROPONIN I (HIGH SENSITIVITY): Troponin I (High Sensitivity): 7 ng/L (ref <18)

## 2022-12-22 ENCOUNTER — Other Ambulatory Visit: Payer: Self-pay | Admitting: Internal Medicine

## 2022-12-22 DIAGNOSIS — Z1231 Encounter for screening mammogram for malignant neoplasm of breast: Secondary | ICD-10-CM

## 2023-01-16 ENCOUNTER — Ambulatory Visit
Admission: RE | Admit: 2023-01-16 | Discharge: 2023-01-16 | Disposition: A | Discharge status: Home / Self Care | Payer: No Typology Code available for payment source | Source: Ambulatory Visit | Attending: Internal Medicine | Admitting: Internal Medicine

## 2023-01-16 DIAGNOSIS — Z1231 Encounter for screening mammogram for malignant neoplasm of breast: Secondary | ICD-10-CM | POA: Diagnosis present

## 2023-01-24 ENCOUNTER — Encounter: Payer: Self-pay | Admitting: Internal Medicine

## 2023-01-24 ENCOUNTER — Inpatient Hospital Stay: Payer: No Typology Code available for payment source | Attending: Internal Medicine | Admitting: Internal Medicine

## 2023-01-24 ENCOUNTER — Inpatient Hospital Stay: Payer: No Typology Code available for payment source

## 2023-01-24 VITALS — BP 120/76 | HR 75 | Temp 99.3°F | Ht 67.0 in | Wt 184.6 lb

## 2023-01-24 DIAGNOSIS — I2699 Other pulmonary embolism without acute cor pulmonale: Secondary | ICD-10-CM

## 2023-01-24 DIAGNOSIS — Z801 Family history of malignant neoplasm of trachea, bronchus and lung: Secondary | ICD-10-CM | POA: Diagnosis not present

## 2023-01-24 DIAGNOSIS — Z87891 Personal history of nicotine dependence: Secondary | ICD-10-CM | POA: Diagnosis not present

## 2023-01-24 DIAGNOSIS — Z86711 Personal history of pulmonary embolism: Secondary | ICD-10-CM | POA: Diagnosis not present

## 2023-01-24 DIAGNOSIS — R918 Other nonspecific abnormal finding of lung field: Secondary | ICD-10-CM | POA: Insufficient documentation

## 2023-01-24 DIAGNOSIS — Z803 Family history of malignant neoplasm of breast: Secondary | ICD-10-CM | POA: Diagnosis not present

## 2023-01-24 DIAGNOSIS — Z7901 Long term (current) use of anticoagulants: Secondary | ICD-10-CM | POA: Diagnosis not present

## 2023-01-24 NOTE — Progress Notes (Signed)
Has recently has a mammogram and CT of chest, clot and mass on CT.   Has trouble swallowing solids, "due to hardware in my neck".

## 2023-01-24 NOTE — Assessment & Plan Note (Addendum)
#   05/23/2022-CTA chest: Right upper lower lobe subsegmental PE;  currently on Eliquis 5 mg twice daily.  # Patient has been on anticoagulation for about 8 months.   # Given the fact the patient has been on anticoagulation for many months; and likely PE was provoked; and the fact that he was subsegmental; July 2024-D-dimer negative-I think it is reasonable to take the patient off anticoagulation at this time.   # Scattered lung nodules-CT scan January 2024 3 mm left upper lobe lung nodule; subsequent CT scan September 2024-noncontrast less than 2 mm lung nodule scattered-likely benign; no progression noted.  Remote history of smoking.  Defer to PCP for further management.   Counseling: I discussed with the patient that prior history of DVT PE is important risk factor for development of subsequent blood clots.  I also discussed the importance of close monitoring of symptoms of blood clots which include but not limited to-sudden chest pain/shortness of breath or swelling of the extremities.   I also counseled the patient regarding importance of keeping hydrated/moving during long car rides and flights; and other periods of immobility.  Also discussed regarding importance of informing the physicians in case patient has to have any emergency/elective orthopedic surgeries especially given history of  hypercoaguable state, and consider prophylactic anticoagulation perioperatively.  Thank you Dr. Dareen Piano for allowing me to participate in the care of your pleasant patient. Please do not hesitate to contact me with questions or concerns in the interim.   # DISPOSITION: # Labs today-ordered # follow up in 2-3 weeks- MD: NO labs- Dr.B  # I reviewed the blood work- with the patient in detail; also reviewed the imaging independently [as summarized above]; and with the patient in detail.   Cc; Dr.Anderson

## 2023-01-24 NOTE — Progress Notes (Signed)
[facility] CONSULT NOTE  Patient Care Team: Lauro Regulus, MD as PCP - General (Internal Medicine) Earna Coder, MD as Consulting Physician (Oncology)  CHIEF COMPLAINTS/PURPOSE OF CONSULTATION: DVT/PE  #  Oncology History   No history exists.   JAN 2024- 23rd- IMPRESSION: 1. Small foci of subsegmental pulmonary embolus seen in the right upper lobe and right lower lobe. 2. Small solid pulmonary nodule of the left lower lobe measuring 3 mm. No follow-up needed if patient is low-risk.This recommendation follows the consensus statement: Guidelines for Management of Incidental Pulmonary Nodules Detected on CT Images: From the Fleischner Society 2017; Radiology 2017; 284:228-243.   Critical Value/emergent results were called by telephone at the time of interpretation on 05/23/2022 at 5:15 pm to provider Greig Right , who verbally acknowledged these results.     Electronically Signed   By: Allegra Lai M.D.   On: 05/23/2022 17:17   HISTORY OF PRESENTING ILLNESS: Patient ambulating-independently.  Alone.  Makayla Villarreal 62 y.o.  female with no prior history of thrombo-embolism has been referred to [place] regarding  JAN 2024- PE.    In JAN 2024- Patient had neck fusion 3-4 day later was diagnosed with PE.Marland Kitchen Post surgery stayed in hospital for 1 night.   Patient noted to have shoulder pain on left side/ pleuritic chest pain 3-4 days after the surgery.. Never prior Hx of blood clot.   Patient had a CT scan in the hospital that showed subsegmental right upper lower lobe PE.  Subsequently patient started on heparin and then transition to [facility]. Bilateral LE venous dopplers: negative. Patient is here for further workup/recommendations.  With regards risk factors: Smoking: remote hx; quit 2005.  Family history: mother a.fib on anticoagulation.   Cancer screening: uptodate.  Birth control pills: none  Review of Systems  Constitutional:  Negative for  chills, diaphoresis, fever, malaise/fatigue and weight loss.  HENT:  Negative for nosebleeds and sore throat.   Eyes:  Negative for double vision.  Respiratory:  Negative for cough, hemoptysis, sputum production, shortness of breath and wheezing.   Cardiovascular:  Negative for chest pain, palpitations, orthopnea and leg swelling.  Gastrointestinal:  Negative for abdominal pain, blood in stool, constipation, diarrhea, heartburn, melena, nausea and vomiting.  Genitourinary:  Negative for dysuria, frequency and urgency.  Musculoskeletal:  Negative for back pain and joint pain.  Skin: Negative.  Negative for itching and rash.  Neurological:  Negative for dizziness, tingling, focal weakness, weakness and headaches.  Endo/Heme/Allergies:  Does not bruise/bleed easily.  Psychiatric/Behavioral:  Negative for depression. The patient is not nervous/anxious and does not have insomnia.      MEDICAL HISTORY:  Past Medical History:  Diagnosis Date   Diabetes mellitus without complication (HCC)    Peripheral arterial disease (HCC)     SURGICAL HISTORY: Past Surgical History:  Procedure Laterality Date   ABDOMINAL HYSTERECTOMY  2008   CESAREAN SECTION     twice   HERNIA REPAIR     KNEE CARTILAGE SURGERY      SOCIAL HISTORY: Social History   Socioeconomic History   Marital status: Married    Spouse name: Not on file   Number of children: Not on file   Years of education: Not on file   Highest education level: Not on file  Occupational History   Not on file  Tobacco Use   Smoking status: Former    Current packs/day: 0.00    Types: Cigarettes    Quit date: 2005  Years since quitting: 19.7   Smokeless tobacco: Never  Vaping Use   Vaping status: Never Used  Substance and Sexual Activity   Alcohol use: Yes   Drug use: No   Sexual activity: Yes    Partners: Male  Other Topics Concern   Not on file  Social History Narrative   Not on file   Social Determinants of Health    Financial Resource Strain: Low Risk  (01/15/2023)   Received from [facility]   Overall Financial Resource Strain (CARDIA)    Difficulty of Paying Living Expenses: Not hard at all  Food Insecurity: No Food Insecurity (01/15/2023)   [facility]   Hunger Vital Sign    Worried About Running Out of Food in the Last Year: Never true    Ran Out of Food in the Last Year: Never true  Transportation Needs: No Transportation Needs (01/15/2023)   Received from [facility] - [facility]    In the past 12 months, has lack of transportation kept you from medical appointments or from getting medications?: No    Lack of Transportation (Non-Medical): No  Physical Activity: Not on file  Stress: Not on file  Social Connections: Not on file  Intimate Partner Violence: Not on file    FAMILY HISTORY: Family History  Problem Relation Age of Onset   Diabetes Mother    Hyperlipidemia Mother    Varicose Veins Mother    Atrial fibrillation Mother    Lung cancer Brother    Breast cancer Maternal Aunt 43   Breast cancer Paternal Aunt        postmenopausal    Varicose Veins Maternal Grandfather     ALLERGIES:  is allergic to codeine, etodolac, and hydrocodone.  MEDICATIONS:  Current Outpatient Medications  Medication Sig Dispense Refill   albuterol (PROVENTIL HFA) 108 (90 Base) MCG/ACT inhaler 2 puffs q.i.d. p.r.n. short of breath, wheezing, or cough     Ascorbic Acid (VITAMIN C) 1000 MG tablet Take by mouth.     cholecalciferol (VITAMIN D3) 25 MCG (1000 UNIT) tablet Take 4,000 Units by mouth daily.     fluticasone (FLONASE) 50 MCG/ACT nasal spray Place into the nose.     hydrocortisone (ANUSOL-HC) 2.5 % rectal cream Apply 1 Application topically 3 (three) times daily.     levothyroxine (SYNTHROID) 125 MCG tablet Take 112 mcg by mouth daily before breakfast. Takes 112 mcg 5 days a week, and 125 mcg 2 days a week      Loratadine 10 MG CAPS Take by mouth.     magnesium oxide (MAG-OX) 400 MG tablet Take 250 mg by mouth daily.     metFORMIN (GLUCOPHAGE) 500 MG tablet take 1 tablet by mouth twice a day with meals     ondansetron (ZOFRAN-ODT) 8 MG disintegrating tablet Take 8 mg by mouth every 8 (eight) hours as needed for nausea or vomiting.     apixaban (ELIQUIS) 5 MG TABS tablet Take 2 tablets (10 mg total) by mouth 2 (two) times daily for 7 days. 28 tablet 0   apixaban (ELIQUIS) 5 MG TABS tablet Take 1 tablet (5 mg total) by mouth 2 (two) times daily. 60 tablet 0   diazepam (VALIUM) 2 MG tablet take 1 tablet by mouth at bedtime if needed for anxiety (Patient not taking: Reported on 01/24/2023)  0   No current facility-administered medications for this visit.       PHYSICAL EXAMINATION:  Vitals:   01/24/23 1057  BP: 120/76  Pulse: 75  Temp: 99.3 F (37.4 C)  SpO2: 98%   Filed Weights   01/24/23 1057  Weight: 184 lb 9.6 oz (83.7 kg)    Physical Exam Vitals and nursing note reviewed.  HENT:     Head: Normocephalic and atraumatic.     Mouth/Throat:     Pharynx: Oropharynx is clear.  Eyes:     Extraocular Movements: Extraocular movements intact.     Pupils: Pupils are equal, round, and reactive to light.  Cardiovascular:     Rate and Rhythm: Normal rate and regular rhythm.  Pulmonary:     Comments: Decreased breath sounds bilaterally.  Abdominal:     Palpations: Abdomen is soft.  Musculoskeletal:        General: Normal range of motion.     Cervical back: Normal range of motion.  Skin:    General: Skin is warm.  Neurological:     General: No focal deficit present.     Mental Status: She is alert and oriented to person, place, and time.  Psychiatric:        Behavior: Behavior normal.        Judgment: Judgment normal.      LABORATORY DATA:  I have reviewed the data as listed Lab Results  Component Value Date   WBC 6.9 05/25/2022   HGB 12.7 05/25/2022   HCT 38.7 05/25/2022    MCV 89.8 05/25/2022   PLT 266 05/25/2022   Recent Labs    05/23/22 1449 05/24/22 0413 05/25/22 0529  NA 138 137 138  K 3.9 3.9 4.1  CL 102 106 105  CO2 27 27 26   GLUCOSE 168* 94 95  BUN 11 13 20   CREATININE 0.75 0.69 0.75  CALCIUM 9.1 8.8* 8.9  GFRNONAA >60 >60 >60  PROT  --   --  6.6  ALBUMIN  --   --  3.5  AST  --   --  16  ALT  --   --  10  ALKPHOS  --   --  50  BILITOT  --   --  0.5    RADIOGRAPHIC STUDIES: I have personally reviewed the radiological images as listed and agreed with the findings in the report. MM 3D SCREENING MAMMOGRAM BILATERAL BREAST  Result Date: 01/17/2023 CLINICAL DATA:  Screening. EXAM: DIGITAL SCREENING BILATERAL MAMMOGRAM WITH TOMOSYNTHESIS AND CAD TECHNIQUE: Bilateral screening digital craniocaudal and mediolateral oblique mammograms were obtained. Bilateral screening digital breast tomosynthesis was performed. The images were evaluated with computer-aided detection. COMPARISON:  Previous exam(s). ACR Breast Density Category b: There are scattered areas of fibroglandular density. FINDINGS: There are no findings suspicious for malignancy. IMPRESSION: No mammographic evidence of malignancy. A result letter of this screening mammogram will be mailed directly to the patient. RECOMMENDATION: Screening mammogram in one year. (Code:SM-B-01Y) BI-RADS CATEGORY  1: Negative. Electronically Signed   By: Elberta Fortis M.D.   On: 01/17/2023 11:13    ASSESSMENT & PLAN:   Acute pulmonary embolism without acute cor pulmonale (HCC) # 05/23/2022-CTA chest: Right upper lower lobe subsegmental PE;  currently on Eliquis 5 mg twice daily.  # Patient has been on anticoagulation for about 8 months.   # Given the fact the patient has been on anticoagulation for many months; and likely PE was provoked; and the fact that he was subsegmental; July 2024-D-dimer negative-I think it is reasonable to take the patient off anticoagulation at this time.   # Scattered  lung  nodules-CT scan January 2024 3 mm left upper lobe lung nodule; subsequent CT scan September 2024-noncontrast less than 2 mm lung nodule scattered-likely benign; no progression noted.  Remote history of smoking.  Defer to PCP for further management.   Counseling: I discussed with the patient that prior history of DVT PE is important risk factor for development of subsequent blood clots.  I also discussed the importance of close monitoring of symptoms of blood clots which include but not limited to-sudden chest pain/shortness of breath or swelling of the extremities.   I also counseled the patient regarding importance of keeping hydrated/moving during long car rides and flights; and other periods of immobility.  Also discussed regarding importance of informing the physicians in case patient has to have any emergency/elective orthopedic surgeries especially given history of  hypercoaguable state, and consider prophylactic anticoagulation perioperatively.  Thank you Dr. Dareen Piano for allowing me to participate in the care of your pleasant patient. Please do not hesitate to contact me with questions or concerns in the interim.   # DISPOSITION: # Labs today-ordered # follow up in 2-3 weeks- MD: NO labs- Dr.B  # I reviewed the blood work- with the patient in detail; also reviewed the imaging independently [as summarized above]; and with the patient in detail.   Cc; Dr.Anderson    Earna Coder, MD 01/24/2023 12:15 PM

## 2023-02-13 ENCOUNTER — Emergency Department: Payer: PRIVATE HEALTH INSURANCE

## 2023-02-13 ENCOUNTER — Other Ambulatory Visit: Payer: Self-pay

## 2023-02-13 ENCOUNTER — Emergency Department
Admission: EM | Admit: 2023-02-13 | Discharge: 2023-02-13 | Disposition: A | Discharge status: Home / Self Care | Payer: PRIVATE HEALTH INSURANCE | Attending: Emergency Medicine | Admitting: Emergency Medicine

## 2023-02-13 DIAGNOSIS — R0789 Other chest pain: Secondary | ICD-10-CM | POA: Diagnosis present

## 2023-02-13 LAB — CBC
HCT: 42.5 % (ref 36.0–46.0)
Hemoglobin: 14.3 g/dL (ref 12.0–15.0)
MCH: 30.3 pg (ref 26.0–34.0)
MCHC: 33.6 g/dL (ref 30.0–36.0)
MCV: 90 fL (ref 80.0–100.0)
Platelets: 254 10*3/uL (ref 150–400)
RBC: 4.72 MIL/uL (ref 3.87–5.11)
RDW: 12.7 % (ref 11.5–15.5)
WBC: 6.7 10*3/uL (ref 4.0–10.5)
nRBC: 0 % (ref 0.0–0.2)

## 2023-02-13 LAB — BASIC METABOLIC PANEL
Anion gap: 6 (ref 5–15)
BUN: 15 mg/dL (ref 6–20)
CO2: 27 mmol/L (ref 22–32)
Calcium: 8.7 mg/dL — ABNORMAL LOW (ref 8.9–10.3)
Chloride: 101 mmol/L (ref 98–111)
Creatinine, Ser: 0.8 mg/dL (ref 0.44–1.00)
GFR, Estimated: >60 mL/min (ref >60)
Glucose, Bld: 89 mg/dL (ref 70–99)
Potassium: 3.7 mmol/L (ref 3.5–5.1)
Sodium: 134 mmol/L — ABNORMAL LOW (ref 135–145)

## 2023-02-13 LAB — TROPONIN I (HIGH SENSITIVITY)
Troponin I (High Sensitivity): 6 ng/L (ref <18)
Troponin I (High Sensitivity): 7 ng/L (ref <18)

## 2023-02-13 MED ORDER — IOHEXOL 350 MG/ML SOLN
75.0000 mL | Freq: Once | INTRAVENOUS | Status: AC | PRN
  Administered 2023-02-13: 75 mL via INTRAVENOUS

## 2023-02-13 NOTE — ED Provider Notes (Signed)
62 yo F with PMHx provoked PE, DM, here with chest pain. Likely MSK. EKG nonischemic and trop neg x 2. CT Angio is pending, likely d/c home if negative.  CT angio was negative.  Patient pain is improved.  Negative troponins x 2.  EKG nonischemic.  Do not suspect ACS or PE.  No other abnormality noted on CT angio.  Of note, the patient states that she was stirring a large pot of dog food the day prior to the onset of her pain.  Suspect likely musculoskeletal pain.  Will discharge with supportive care and good return precautions.   Shaune Pollack, MD 02/13/23 [phone]

## 2023-02-13 NOTE — ED Provider Notes (Signed)
[facility] Provider Note    Event Date/Time   First MD Initiated Contact with Patient 02/13/23 1334     (approximate)   History   Chest Pain   HPI  Makayla Villarreal is a 62 y.o. female who presents to the emergency department today because of concerns for chest pain.  Patient noticed it yesterday.  She was then had a hard time sleeping last night.  Is located on the left side of her chest.  It does come and go.  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  She was just taken off of her blood thinners couple of weeks ago by her hematologist.  The patient denies any recent surgeries or travel.     Physical Exam   Triage Vital Signs: ED Triage Vitals  Encounter Vitals Group     BP 02/13/23 1330 (!) 154/87     Systolic BP Percentile --      Diastolic BP Percentile --      Pulse Rate 02/13/23 1330 76     Resp 02/13/23 1330 20     Temp 02/13/23 1330 98.7 F (37.1 C)     Temp src --      SpO2 02/13/23 1330 100 %     Weight 02/13/23 1331 182 lb 15.7 oz (83 kg)     Height 02/13/23 1331 5\' 7"  (1.702 m)     Head Circumference --      Peak Flow --      Pain Score 02/13/23 1331 1     Pain Loc --      Pain Education --      Exclude from Growth Chart --     Most recent vital signs: Vitals:   02/13/23 1330  BP: (!) 154/87  Pulse: 76  Resp: 20  Temp: 98.7 F (37.1 C)  SpO2: 100%    General: Awake, alert, oriented. CV:  Good peripheral perfusion. Regular rate and rhythm. Resp:  Normal effort. Lungs clear. Abd:  No distention.    ED Results / Procedures / Treatments   Labs (all labs ordered are listed, but only abnormal results are displayed) Labs Reviewed  BASIC METABOLIC PANEL - Abnormal; Notable for the following components:      Result Value   Sodium 134 (*)    Calcium 8.7 (*)    All other components within normal limits  CBC  TROPONIN I (HIGH SENSITIVITY)  TROPONIN I (HIGH SENSITIVITY)      EKG  I, Phineas Semen, attending physician, personally viewed and interpreted this EKG  EKG Time: 1334 Rate: 75 Rhythm: normal sinus rhythm Axis: normal Intervals: qtc 462 QRS: narrow ST changes: no st elevation Impression: normal ekg   RADIOLOGY I independently interpreted and visualized the CXR. My interpretation: No pneumonia Radiology interpretation: Pending at time of sign out   PROCEDURES:  Critical Care performed: No    MEDICATIONS ORDERED IN ED: Medications - No data to display   IMPRESSION / MDM / ASSESSMENT AND PLAN / ED COURSE  I reviewed the triage vital signs and the nursing notes.                              Differential diagnosis includes, but is not limited to, ACS, PE, pneumonia  Patient's presentation is most consistent with acute presentation with potential threat to life or bodily function.   The patient is  on the cardiac monitor to evaluate for evidence of arrhythmia and/or significant heart rate changes.  Patient presents to the emergency department today because of concern for chest pain in the setting of coming off of blood thinner recently. Blood work without concerning troponin elevation. However given history will check CT angio.      FINAL CLINICAL IMPRESSION(S) / ED DIAGNOSES   Chest pain  Note:  This document was prepared using Dragon voice recognition software and may include unintentional dictation errors. Phineas Semen, MD 02/14/23 [phone]

## 2023-02-13 NOTE — ED Triage Notes (Signed)
Pt to ED for chest pain started worsening last few days. Felt palpitations today. Hx PE. Reports pains have woken her out of sleep Recently stopped eliquis

## 2023-09-10 ENCOUNTER — Other Ambulatory Visit: Payer: Self-pay | Admitting: Internal Medicine

## 2023-09-10 ENCOUNTER — Ambulatory Visit
Admission: RE | Admit: 2023-09-10 | Discharge: 2023-09-10 | Disposition: A | Discharge status: Home / Self Care | Source: Ambulatory Visit | Attending: Internal Medicine | Admitting: Internal Medicine

## 2023-09-10 DIAGNOSIS — R600 Localized edema: Secondary | ICD-10-CM

## 2023-12-25 ENCOUNTER — Other Ambulatory Visit: Payer: Self-pay | Admitting: Internal Medicine

## 2023-12-25 DIAGNOSIS — R911 Solitary pulmonary nodule: Secondary | ICD-10-CM

## 2024-01-18 ENCOUNTER — Ambulatory Visit
Admission: RE | Admit: 2024-01-18 | Discharge: 2024-01-18 | Disposition: A | Discharge status: Home / Self Care | Source: Ambulatory Visit | Attending: Internal Medicine | Admitting: Internal Medicine

## 2024-01-18 DIAGNOSIS — R911 Solitary pulmonary nodule: Secondary | ICD-10-CM | POA: Diagnosis present

## 2024-01-25 ENCOUNTER — Other Ambulatory Visit: Payer: Self-pay | Admitting: Internal Medicine

## 2024-01-25 DIAGNOSIS — Z1231 Encounter for screening mammogram for malignant neoplasm of breast: Secondary | ICD-10-CM

## 2024-04-11 ENCOUNTER — Ambulatory Visit
Admission: RE | Admit: 2024-04-11 | Discharge: 2024-04-11 | Disposition: A | Source: Ambulatory Visit | Attending: Internal Medicine | Admitting: Internal Medicine

## 2024-04-11 DIAGNOSIS — Z1231 Encounter for screening mammogram for malignant neoplasm of breast: Secondary | ICD-10-CM
